# Patient Record
Sex: Female | Born: 1945 | Race: White | Hispanic: No | Marital: Married | Smoking: Former smoker
Health system: Southern US, Community
[De-identification: ages and names within clinical notes are randomized; demographics above are authoritative.]

## PROBLEM LIST (undated history)

## (undated) DIAGNOSIS — E079 Disorder of thyroid, unspecified: Secondary | ICD-10-CM

## (undated) DIAGNOSIS — N183 Chronic kidney disease, stage 3 unspecified: Secondary | ICD-10-CM

## (undated) DIAGNOSIS — C44621 Squamous cell carcinoma of skin of unspecified upper limb, including shoulder: Secondary | ICD-10-CM

## (undated) DIAGNOSIS — E039 Hypothyroidism, unspecified: Secondary | ICD-10-CM

## (undated) DIAGNOSIS — J449 Chronic obstructive pulmonary disease, unspecified: Secondary | ICD-10-CM

## (undated) DIAGNOSIS — R7303 Prediabetes: Secondary | ICD-10-CM

## (undated) HISTORY — DX: Chronic obstructive pulmonary disease, unspecified: J44.9

## (undated) HISTORY — DX: Squamous cell carcinoma of skin of unspecified upper limb, including shoulder: C44.621

## (undated) HISTORY — PX: TONSILLECTOMY: SUR1361

## (undated) HISTORY — PX: SQUAMOUS CELL CARCINOMA EXCISION: SHX2433

## (undated) HISTORY — PX: BREAST CYST EXCISION: SHX579

## (undated) HISTORY — PX: FOOT SURGERY: SHX648

## (undated) HISTORY — PX: KNEE SURGERY: SHX244

## (undated) HISTORY — DX: Disorder of thyroid, unspecified: E07.9

---

## 2004-05-25 ENCOUNTER — Ambulatory Visit: Payer: Self-pay | Admitting: Family Medicine

## 2004-06-06 ENCOUNTER — Ambulatory Visit: Payer: Self-pay | Admitting: Family Medicine

## 2004-11-21 ENCOUNTER — Ambulatory Visit: Payer: Self-pay | Admitting: Family Medicine

## 2005-09-04 ENCOUNTER — Ambulatory Visit: Payer: Self-pay | Admitting: Unknown Physician Specialty

## 2005-12-05 ENCOUNTER — Ambulatory Visit: Payer: Self-pay | Admitting: Family Medicine

## 2006-05-21 ENCOUNTER — Ambulatory Visit: Payer: Self-pay | Admitting: Family Medicine

## 2006-08-12 ENCOUNTER — Ambulatory Visit: Payer: Self-pay | Admitting: Family Medicine

## 2007-03-19 ENCOUNTER — Ambulatory Visit: Payer: Self-pay | Admitting: Family Medicine

## 2008-03-02 ENCOUNTER — Ambulatory Visit: Payer: Self-pay | Admitting: Family Medicine

## 2008-06-16 ENCOUNTER — Ambulatory Visit: Payer: Self-pay | Admitting: Family Medicine

## 2009-06-30 ENCOUNTER — Ambulatory Visit: Payer: Self-pay | Admitting: Family Medicine

## 2012-08-05 DIAGNOSIS — C44621 Squamous cell carcinoma of skin of unspecified upper limb, including shoulder: Secondary | ICD-10-CM

## 2012-08-05 HISTORY — DX: Squamous cell carcinoma of skin of unspecified upper limb, including shoulder: C44.621

## 2016-10-11 ENCOUNTER — Ambulatory Visit
Admission: EM | Admit: 2016-10-11 | Discharge: 2016-10-11 | Disposition: A | Discharge status: Home / Self Care | Payer: Medicare Other | Attending: Family Medicine | Admitting: Family Medicine

## 2016-10-11 DIAGNOSIS — J0110 Acute frontal sinusitis, unspecified: Secondary | ICD-10-CM | POA: Diagnosis not present

## 2016-10-11 DIAGNOSIS — R0981 Nasal congestion: Secondary | ICD-10-CM | POA: Diagnosis present

## 2016-10-11 DIAGNOSIS — Z87891 Personal history of nicotine dependence: Secondary | ICD-10-CM | POA: Insufficient documentation

## 2016-10-11 DIAGNOSIS — J029 Acute pharyngitis, unspecified: Secondary | ICD-10-CM

## 2016-10-11 DIAGNOSIS — E039 Hypothyroidism, unspecified: Secondary | ICD-10-CM | POA: Diagnosis not present

## 2016-10-11 LAB — RAPID STREP SCREEN (MED CTR MEBANE ONLY): STREPTOCOCCUS, GROUP A SCREEN (DIRECT): NEGATIVE

## 2016-10-11 MED ORDER — AMOXICILLIN-POT CLAVULANATE 875-125 MG PO TABS: 1.0000 | ORAL_TABLET | Freq: Two times a day (BID) | ORAL | 0 refills | Status: DC

## 2016-10-11 NOTE — ED Provider Notes (Signed)
[facility] ____________________________________________  Time seen: Approximately 9:29 AM  I have reviewed the triage vital signs and the nursing notes.   HISTORY  Chief Complaint Sore Throat   HPI Catherine Ray is a 71 y.o. female  Presenting for complaints of 7-8 days of runny nose, nasal congestion, sore throat, sinus pressure and occasional cough. Denies fevers. Reports symptoms unresolved with otc mucinex and allegra. Reports some seasonal allergy history. Reports no home sick contacts, around a recent mover that was sick. Reports continues to eat and drink overall well. States sore throat has been moderate and can feel postnasal drainage. Reports has continued to remain active. Denies other complaints.   Denies chest pain, shortness of breath, abdominal pain, dysuria, extremity pain, extremity swelling or rash. Denies recent sickness. Reports currently moving and getting established in this area.    PCP: [place]   past medical history Hypothyroidism Seasonal allergies  There are no active problems to display for this patient.   Past Surgical History:  Procedure Laterality Date  . FOOT SURGERY    . KNEE SURGERY    . SQUAMOUS CELL CARCINOMA EXCISION     Arm  . TONSILLECTOMY       No current facility-administered medications for this encounter.   Current Outpatient Prescriptions:  .  albuterol (PROVENTIL) (2.5 MG/3ML) 0.083% nebulizer solution, Take 2.5 mg by nebulization as needed for wheezing or shortness of breath., Disp: , Rfl:  .  cholecalciferol (VITAMIN D) 1000 units tablet, Take 2,000 Units by mouth daily., Disp: , Rfl:  .  latanoprost (XALATAN) 0.005 % ophthalmic solution, 1 drop at bedtime., Disp: , Rfl:  .  levothyroxine (SYNTHROID, LEVOTHROID) 88 MCG tablet, Take 88 mcg by mouth daily before breakfast., Disp: , Rfl:  .  tiotropium (SPIRIVA) 18 MCG inhalation capsule, Place 18 mcg into inhaler and inhale daily., Disp: , Rfl:  .   amoxicillin-clavulanate (AUGMENTIN) 875-125 MG tablet, Take 1 tablet by mouth every 12 (twelve) hours., Disp: 20 tablet, Rfl: 0  Allergies Codeine  Family History  Problem Relation Age of Onset  . Cancer Mother   . Heart failure Father     Social History Social History  Substance Use Topics  . Smoking status: Former Smoker    Types: Cigarettes  . Smokeless tobacco: Never Used  . Alcohol use No    Review of Systems Constitutional: No fever/chills Eyes: No visual changes. ENT: As above. Cardiovascular: Denies chest pain. Respiratory: Denies shortness of breath. Gastrointestinal: No abdominal pain.   Genitourinary: Negative for dysuria. Musculoskeletal: Negative for back pain. Skin: Negative for rash. Neurological: Negative for focal weakness or numbness.  10-point ROS otherwise negative.  ____________________________________________   PHYSICAL EXAM:  VITAL SIGNS: ED Triage Vitals  Enc Vitals Group     BP 10/11/16 0913 140/67     Pulse Rate 10/11/16 0913 88     Resp 10/11/16 0913 20     Temp 10/11/16 0913 99 F (37.2 C)     Temp Source 10/11/16 0913 Oral     SpO2 10/11/16 0913 99 %     Weight 10/11/16 0912 185 lb (83.9 kg)     Height 10/11/16 0912 5\' 3"  (1.6 m)     Head Circumference --      Peak Flow --      Pain Score 10/11/16 0913 9     Pain Loc --      Pain Edu? --      Excl. in [place]? --  Constitutional: Alert and oriented. Well appearing and in no acute distress. Eyes: Conjunctivae are normal. PERRL. EOMI. Head: Atraumatic.Mild tenderness to palpation bilateral frontal and nontender maxillary sinuses. No swelling. No erythema.   Ears: no erythema, normal TMs bilaterally.   Nose: nasal congestion with bilateral nasal turbinate erythema and edema.   Mouth/Throat: Mucous membranes are moist.  Mild pharyngeal erythema.No tonsillar swelling or exudate.  Neck: No stridor.  No cervical spine tenderness to palpation. Hematological/Lymphatic/Immunilogical:  No cervical lymphadenopathy. Cardiovascular: Normal rate, regular rhythm. Grossly normal heart sounds.  Good peripheral circulation. Respiratory: Normal respiratory effort.  No retractions.  No wheezes, rales or rhonchi. Good air movement.  Gastrointestinal: Soft and nontender.  Musculoskeletal: No lower extremity edema noted. No cervical, thoracic or lumbar tenderness to palpation.  Neurologic:  Normal speech and language.No gait instability. Skin:  Skin is warm, dry and intact. No rash noted. Psychiatric: Mood and affect are normal. Speech and behavior are normal.  ___________________________________________   LABS (all labs ordered are listed, but only abnormal results are displayed)  Labs Reviewed  RAPID STREP SCREEN (NOT AT [facility])  CULTURE, GROUP A STREP [facility])     PROCEDURES Procedures   INITIAL IMPRESSION / ASSESSMENT AND PLAN / ED COURSE  Pertinent labs & imaging results that were available during my care of the patient were reviewed by me and considered in my medical decision making (see chart for details).  Well appearing, no acute distress. Quick strep negative, will culture. Suspect sinusitis and postnasal drainage. Discussed with patient viral and allergic factors. Encouraged supportive care, antihistamine, gargles, and will start patient on oral Augmentin. Discussed indication, risks and benefits of medications with patient.Information given for local ENT and PCP.   Discussed follow up with Primary care physician this week as needed. Discussed follow up and return parameters including no resolution or any worsening concerns. Patient verbalized understanding and agreed to plan.   ____________________________________________   FINAL CLINICAL IMPRESSION(S) / ED DIAGNOSES  Final diagnoses:  Acute frontal sinusitis, recurrence not specified  Pharyngitis, unspecified etiology     Discharge Medication List as of 10/11/2016  9:42 AM    START taking these medications    Details  amoxicillin-clavulanate (AUGMENTIN) 875-125 MG tablet Take 1 tablet by mouth every 12 (twelve) hours., Starting Fri 10/11/2016, Normal        Note: This dictation was prepared with Dragon dictation along with smaller phrase technology. Any transcriptional errors that result from this process are unintentional.         Marylene Land, NP 10/11/16 1130

## 2016-10-11 NOTE — Discharge Instructions (Signed)
Take medication as prescribed. Rest. Drink plenty of fluids.  ° °Follow up with your primary care physician this week as needed. Return to Urgent care for new or worsening concerns.  ° °

## 2016-10-11 NOTE — ED Triage Notes (Signed)
Pt c/o sore throat, congestion, ear ache and cough for a few days.

## 2016-10-13 LAB — CULTURE, GROUP A STREP (THRC)

## 2017-04-28 ENCOUNTER — Other Ambulatory Visit
Admission: RE | Admit: 2017-04-28 | Discharge: 2017-04-28 | Disposition: A | Discharge status: Home / Self Care | Payer: Medicare Other | Source: Ambulatory Visit | Attending: Family Medicine | Admitting: Family Medicine

## 2017-04-28 ENCOUNTER — Encounter: Payer: Self-pay | Admitting: Family Medicine

## 2017-04-28 ENCOUNTER — Ambulatory Visit (INDEPENDENT_AMBULATORY_CARE_PROVIDER_SITE_OTHER): Payer: Medicare Other | Admitting: Family Medicine

## 2017-04-28 VITALS — BP 128/82 | HR 66 | Resp 16 | Ht 63.0 in | Wt 182.6 lb

## 2017-04-28 DIAGNOSIS — E559 Vitamin D deficiency, unspecified: Secondary | ICD-10-CM | POA: Insufficient documentation

## 2017-04-28 DIAGNOSIS — E039 Hypothyroidism, unspecified: Secondary | ICD-10-CM

## 2017-04-28 DIAGNOSIS — M150 Primary generalized (osteo)arthritis: Secondary | ICD-10-CM | POA: Diagnosis not present

## 2017-04-28 DIAGNOSIS — M79672 Pain in left foot: Secondary | ICD-10-CM

## 2017-04-28 DIAGNOSIS — G8929 Other chronic pain: Secondary | ICD-10-CM | POA: Diagnosis not present

## 2017-04-28 DIAGNOSIS — H409 Unspecified glaucoma: Secondary | ICD-10-CM

## 2017-04-28 DIAGNOSIS — R7303 Prediabetes: Secondary | ICD-10-CM

## 2017-04-28 DIAGNOSIS — Z23 Encounter for immunization: Secondary | ICD-10-CM

## 2017-04-28 DIAGNOSIS — E669 Obesity, unspecified: Secondary | ICD-10-CM | POA: Diagnosis not present

## 2017-04-28 DIAGNOSIS — J449 Chronic obstructive pulmonary disease, unspecified: Secondary | ICD-10-CM | POA: Insufficient documentation

## 2017-04-28 DIAGNOSIS — M1990 Unspecified osteoarthritis, unspecified site: Secondary | ICD-10-CM | POA: Insufficient documentation

## 2017-04-28 DIAGNOSIS — E66811 Obesity, class 1: Secondary | ICD-10-CM

## 2017-04-28 DIAGNOSIS — M159 Polyosteoarthritis, unspecified: Secondary | ICD-10-CM

## 2017-04-28 LAB — COMPREHENSIVE METABOLIC PANEL
ALBUMIN: 4.1 g/dL (ref 3.5–5.0)
ALT: 13 U/L — ABNORMAL LOW (ref 14–54)
ANION GAP: 7 (ref 5–15)
AST: 16 U/L (ref 15–41)
Alkaline Phosphatase: 79 U/L (ref 38–126)
BILIRUBIN TOTAL: 0.4 mg/dL (ref 0.3–1.2)
BUN: 18 mg/dL (ref 6–20)
CALCIUM: 9.2 mg/dL (ref 8.9–10.3)
CO2: 29 mmol/L (ref 22–32)
Chloride: 103 mmol/L (ref 101–111)
Creatinine, Ser: 0.93 mg/dL (ref 0.44–1.00)
Glucose, Bld: 108 mg/dL — ABNORMAL HIGH (ref 65–99)
Potassium: 4.8 mmol/L (ref 3.5–5.1)
Sodium: 139 mmol/L (ref 135–145)
TOTAL PROTEIN: 7.6 g/dL (ref 6.5–8.1)

## 2017-04-28 LAB — CBC
HEMATOCRIT: 38.8 % (ref 35.0–47.0)
HEMOGLOBIN: 13.1 g/dL (ref 12.0–16.0)
MCH: 29.8 pg (ref 26.0–34.0)
MCHC: 33.7 g/dL (ref 32.0–36.0)
MCV: 88.4 fL (ref 80.0–100.0)
Platelets: 289 10*3/uL (ref 150–440)
RBC: 4.39 MIL/uL (ref 3.80–5.20)
RDW: 13.3 % (ref 11.5–14.5)
WBC: 6.3 10*3/uL (ref 3.6–11.0)

## 2017-04-28 LAB — HEMOGLOBIN A1C
Hgb A1c MFr Bld: 6 % — ABNORMAL HIGH (ref 4.8–5.6)
MEAN PLASMA GLUCOSE: 125.5 mg/dL

## 2017-04-28 LAB — LIPID PANEL
CHOL/HDL RATIO: 3 ratio
CHOLESTEROL: 186 mg/dL (ref 0–200)
HDL: 61 mg/dL (ref >40)
LDL Cholesterol: 104 mg/dL — ABNORMAL HIGH (ref 0–99)
TRIGLYCERIDES: 106 mg/dL (ref <150)
VLDL: 21 mg/dL (ref 0–40)

## 2017-04-28 LAB — TSH: TSH: 0.414 u[IU]/mL (ref 0.350–4.500)

## 2017-04-28 MED ORDER — ACETAMINOPHEN 500 MG PO TABS: 1000.0000 mg | ORAL_TABLET | Freq: Three times a day (TID) | ORAL | 0 refills | Status: DC | PRN

## 2017-04-28 NOTE — Progress Notes (Signed)
Date:  04/28/2017   Name:  Catherine Ray   DOB:  [birth year]   MRN:  [id number]  PCP:  Adline Potter, MD    Chief Complaint: Establish Care (New to area. ); Foot Problem (Left foot pain needs referral to foot doctor. ); COPD (needs Pulm ref); and Thyroid Problem (will need refill)   History of Present Illness:  This is a 71 y.o. female seen for initial visit. Just moved back to area from [place] chronic L foot pain, has seen podiatry and ortho, requests podiatry referral. COPD on Spiriva and prn albuterol MDI, has seen Dr. Raul Del in past, requests referral. OA hands, takes Aleve prn. Hypothyroid on Synthroid same dose x yrs, last TSH last year ok. A1c 6.2% last visit, on NAS diet, unable to exercise due to foot pain. Glaucoma on eye gtts, saw optho recently. Takes vit D supp for deficiency, last DEXA 2013 normal. Occ BLE edema which improves overnight. Father died unknown cause, mother died 76 heart dz had thyroid dz/DM, sister with breast ca, sister with MI/DM. Last colonoscopy 2007 showed polyp, wants to defer until more settled. Mammo normal 08/2016. Tdap 2014, Prevnar 07/2015, zoster 2012, unsure about Pneumovax, will check with [facility].  Review of Systems:  Review of Systems  Constitutional: Negative for fever.  HENT: Negative for ear pain, sinus pain and trouble swallowing.   Eyes: Negative for pain.  Respiratory: Negative for cough and shortness of breath.   Cardiovascular: Negative for chest pain.  Gastrointestinal: Negative for abdominal pain, constipation and diarrhea.  Endocrine: Negative for polydipsia and polyuria.  Genitourinary: Negative for difficulty urinating.  Musculoskeletal: Negative for joint swelling.  Neurological: Negative for syncope and light-headedness.    Patient Active Problem List   Diagnosis Date Noted  . Hypothyroidism 04/28/2017  . Glaucoma 04/28/2017  . Vitamin D deficiency 04/28/2017  . COPD (chronic obstructive pulmonary disease) ([place])  04/28/2017  . Chronic pain in left foot 04/28/2017  . Prediabetes 04/28/2017    Prior to Admission medications   Medication Sig Start Date End Date Taking? Authorizing Provider  albuterol (PROVENTIL HFA;VENTOLIN HFA) 108 (90 Base) MCG/ACT inhaler Inhale 2 puffs into the lungs every 6 (six) hours as needed.   Yes [provider]  cholecalciferol (VITAMIN D) 1000 units tablet Take 1,000 Units by mouth daily.   Yes [provider]  latanoprost (XALATAN) 0.005 % ophthalmic solution 1 drop at bedtime.   Yes [provider]  levothyroxine (SYNTHROID, LEVOTHROID) 88 MCG tablet Take 88 mcg by mouth daily before breakfast.   Yes [provider]  tiotropium (SPIRIVA) 18 MCG inhalation capsule Place 18 mcg into inhaler and inhale daily.   Yes [provider]    Allergies  Allergen Reactions  . Codeine Itching and Nausea Only    Past Surgical History:  Procedure Laterality Date  . FOOT SURGERY    . KNEE SURGERY    . SQUAMOUS CELL CARCINOMA EXCISION     Arm  . TONSILLECTOMY      Social History  Substance Use Topics  . Smoking status: Former Smoker    Types: Cigarettes  . Smokeless tobacco: Never Used  . Alcohol use No    Family History  Problem Relation Age of Onset  . Cancer Mother   . Hypertension Mother   . Diabetes Mother   . Heart failure Father   . Hypertension Sister   . Diabetes Sister   . Cancer Sister   . Stroke Sister  Medication list has been reviewed and updated.  Physical Examination: BP 128/82   Pulse 66   Resp 16   Ht 5\' 3"  (1.6 m)   Wt 182 lb 9.6 oz (82.8 kg)   SpO2 98%   BMI 32.35 kg/m   Physical Exam  Constitutional: She is oriented to person, place, and time. She appears well-developed and well-nourished.  HENT:  Right Ear: External ear normal.  Left Ear: External ear normal.  Nose: Nose normal.  Mouth/Throat: Oropharynx is clear and moist.  TMs clear  Eyes: Pupils are equal, round, and reactive  to light. Conjunctivae and EOM are normal.  Neck: Neck supple. No thyromegaly present.  Cardiovascular: Normal rate, regular rhythm and normal heart sounds.   Pulmonary/Chest: Effort normal and breath sounds normal.  Abdominal: Soft. She exhibits no distension and no mass. There is no tenderness.  Musculoskeletal:  Trace BLE edema  Lymphadenopathy:    She has no cervical adenopathy.  Neurological: She is alert and oriented to person, place, and time. Coordination normal.  Romberg neg, gait normal  Skin: Skin is warm and dry.  Psychiatric: She has a normal mood and affect. Her behavior is normal.  Nursing note and vitals reviewed.   Assessment and Plan:  1. Chronic obstructive pulmonary disease, unspecified COPD type ([place]) Stable on Spiriva. prn albuterol - Ambulatory referral to Pulmonology - Comprehensive Metabolic Panel (CMET) - CBC  2. Chronic pain in left foot Has seen podiatry/ortho in past - Ambulatory referral to Podiatry  3. Hypothyroidism, unspecified type On Synthroid - TSH - Lipid Profile  4. Glaucoma, unspecified glaucoma type, unspecified laterality On eye gtts per optho  5. Vitamin D deficiency On supplement - Vitamin D (25 hydroxy)  6. Primary osteoarthritis involving multiple joints Advised change prn Aleve to prn Tylenol  7. Prediabetes On NAS diet - HgB A1c  8. Obesity (BMI 30.0-34.9) Exercise/ weight loss discussed  9. Need for influenza vaccination - Flu vaccine HIGH DOSE PF (Fluzone High dose)  Return in about 4 weeks (around 05/26/2017).   45 mins spent with pt over half in counseling  Satira Anis. [facility]  04/28/2017

## 2017-04-29 LAB — VITAMIN D 25 HYDROXY (VIT D DEFICIENCY, FRACTURES): Vit D, 25-Hydroxy: 47.4 ng/mL (ref 30.0–100.0)

## 2017-05-27 ENCOUNTER — Encounter: Payer: Self-pay | Admitting: Family Medicine

## 2017-05-27 ENCOUNTER — Ambulatory Visit (INDEPENDENT_AMBULATORY_CARE_PROVIDER_SITE_OTHER): Payer: Medicare Other | Admitting: Family Medicine

## 2017-05-27 VITALS — BP 122/82 | HR 71 | Resp 16 | Ht 63.0 in | Wt 184.0 lb

## 2017-05-27 DIAGNOSIS — G8929 Other chronic pain: Secondary | ICD-10-CM

## 2017-05-27 DIAGNOSIS — M159 Polyosteoarthritis, unspecified: Secondary | ICD-10-CM

## 2017-05-27 DIAGNOSIS — Z23 Encounter for immunization: Secondary | ICD-10-CM

## 2017-05-27 DIAGNOSIS — R7303 Prediabetes: Secondary | ICD-10-CM

## 2017-05-27 DIAGNOSIS — E039 Hypothyroidism, unspecified: Secondary | ICD-10-CM

## 2017-05-27 DIAGNOSIS — J449 Chronic obstructive pulmonary disease, unspecified: Secondary | ICD-10-CM

## 2017-05-27 DIAGNOSIS — E559 Vitamin D deficiency, unspecified: Secondary | ICD-10-CM | POA: Diagnosis not present

## 2017-05-27 DIAGNOSIS — M79672 Pain in left foot: Secondary | ICD-10-CM

## 2017-05-27 DIAGNOSIS — E669 Obesity, unspecified: Secondary | ICD-10-CM

## 2017-05-27 DIAGNOSIS — Z1231 Encounter for screening mammogram for malignant neoplasm of breast: Secondary | ICD-10-CM | POA: Diagnosis not present

## 2017-05-27 DIAGNOSIS — M150 Primary generalized (osteo)arthritis: Secondary | ICD-10-CM | POA: Diagnosis not present

## 2017-05-27 DIAGNOSIS — Z1239 Encounter for other screening for malignant neoplasm of breast: Secondary | ICD-10-CM

## 2017-05-27 NOTE — Progress Notes (Signed)
Date:  05/27/2017   Name:  Catherine Ray   DOB:  [birth year]   MRN:  [id number]  PCP:  Adline Potter, MD    Chief Complaint: COPD (f/u needs Mammogram and Dexa ordered and she can go down and have them request last mammo and Dexa from last doctor out of state. )   History of Present Illness:  This is a 71 y.o. female seen for one month f/u from initial visit. COPD stable, to see pulm next week. Saw podiatry for L foot pain, unable to help. TSH ok on Synthroid. Taking Tylenol prn OA pain. Prediabetes stable. Had Prevnar, Pneumovax due. Had Tdap 2015. Colonoscopy 2013 with polyp, told repeat in 5 yrs, wants to wait until things settle. FH breast ca, last mammo 08/2016, DEXA normal 2013, vit D level normal on supplement.  Review of Systems:  Review of Systems  Constitutional: Negative for chills and fever.  Respiratory: Negative for cough and shortness of breath.   Cardiovascular: Negative for chest pain and leg swelling.  Genitourinary: Negative for difficulty urinating.  Neurological: Negative for syncope and light-headedness.    Patient Active Problem List   Diagnosis Date Noted  . Hypothyroidism 04/28/2017  . Glaucoma 04/28/2017  . Vitamin D deficiency 04/28/2017  . COPD (chronic obstructive pulmonary disease) ([place]) 04/28/2017  . Chronic pain in left foot 04/28/2017  . Prediabetes 04/28/2017  . Osteoarthritis 04/28/2017  . Obesity (BMI 30.0-34.9) 04/28/2017    Prior to Admission medications   Medication Sig Start Date End Date Taking? Authorizing Provider  acetaminophen (TYLENOL) 500 MG tablet Take 2 tablets by mouth 2 (two) times daily as needed. 04/28/17  Yes [provider]  albuterol (PROVENTIL HFA;VENTOLIN HFA) 108 (90 Base) MCG/ACT inhaler Inhale 2 puffs into the lungs every 6 (six) hours as needed.   Yes [provider]  cholecalciferol (VITAMIN D) 1000 units tablet Take 1,000 Units by mouth daily.   Yes [provider]  latanoprost (XALATAN)  0.005 % ophthalmic solution 1 drop at bedtime.   Yes [provider]  levothyroxine (SYNTHROID, LEVOTHROID) 88 MCG tablet Take 88 mcg by mouth daily before breakfast.   Yes [provider]  tiotropium (SPIRIVA) 18 MCG inhalation capsule Place 18 mcg into inhaler and inhale daily.   Yes [provider]    Allergies  Allergen Reactions  . Codeine Itching and Nausea Only    Past Surgical History:  Procedure Laterality Date  . FOOT SURGERY    . KNEE SURGERY    . SQUAMOUS CELL CARCINOMA EXCISION     Arm  . TONSILLECTOMY      Social History  Substance Use Topics  . Smoking status: Former Smoker    Types: Cigarettes  . Smokeless tobacco: Never Used  . Alcohol use No    Family History  Problem Relation Age of Onset  . Cancer Mother   . Hypertension Mother   . Diabetes Mother   . Heart failure Father   . Hypertension Sister   . Diabetes Sister   . Cancer Sister   . Stroke Sister     Medication list has been reviewed and updated.  Physical Examination: BP 122/82   Pulse 71   Resp 16   Ht 5\' 3"  (1.6 m)   Wt 184 lb (83.5 kg)   SpO2 97%   BMI 32.59 kg/m   Physical Exam  Constitutional: She appears well-developed and well-nourished.  Cardiovascular: Normal rate, regular rhythm and normal heart sounds.   Pulmonary/Chest:  Effort normal and breath sounds normal.  Musculoskeletal: She exhibits no edema.  Neurological: She is alert.  Skin: Skin is warm and dry.  Psychiatric: She has a normal mood and affect. Her behavior is normal.  Nursing note and vitals reviewed.   Assessment and Plan:  1. Chronic obstructive pulmonary disease, unspecified COPD type (HCC) Stable on Spiriva/albuterol prn, to see Dr. Raul Del next week, saw in past  2. Hypothyroidism, unspecified type Well controlled on Synthroid  3. Primary osteoarthritis involving multiple joints Adequate control on prn Tylenol  4. Chronic pain in left foot Saw podiatry, no further  rx recs  5. Prediabetes Stable, rx/px discussed, monitor  6. Vitamin D deficiency Well controlled on supplement  7. Obesity (BMI 30.0-34.9) Weight up 2#, exercise/weight loss discussed  8. Need for pneumococcal vaccination - Pneumococcal polysaccharide vaccine 23-valent greater than or equal to 2yo subcutaneous/IM  9. Breast cancer screening - MM Digital Screening; Future  Return in about 6 months (around 11/25/2017).  Satira Anis. [facility]  05/27/2017

## 2017-06-13 ENCOUNTER — Inpatient Hospital Stay
Admission: RE | Admit: 2017-06-13 | Discharge: 2017-06-13 | Disposition: A | Discharge status: Home / Self Care | Payer: Self-pay | Source: Ambulatory Visit | Attending: *Deleted | Admitting: *Deleted

## 2017-06-13 ENCOUNTER — Other Ambulatory Visit: Payer: Self-pay | Admitting: *Deleted

## 2017-06-13 DIAGNOSIS — Z9289 Personal history of other medical treatment: Secondary | ICD-10-CM

## 2017-07-01 ENCOUNTER — Telehealth: Payer: Self-pay

## 2017-07-01 NOTE — Telephone Encounter (Signed)
Needs refill on Levothyroxine.Please send this to [facility].

## 2017-07-02 ENCOUNTER — Other Ambulatory Visit: Payer: Self-pay | Admitting: Family Medicine

## 2017-07-02 MED ORDER — LEVOTHYROXINE SODIUM 88 MCG PO TABS: 88.0000 ug | ORAL_TABLET | Freq: Every day | ORAL | 3 refills | Status: DC

## 2017-07-02 NOTE — Telephone Encounter (Signed)
Done

## 2017-09-10 ENCOUNTER — Ambulatory Visit (INDEPENDENT_AMBULATORY_CARE_PROVIDER_SITE_OTHER): Payer: Medicare Other | Admitting: Family Medicine

## 2017-09-10 ENCOUNTER — Encounter: Payer: Self-pay | Admitting: Family Medicine

## 2017-09-10 VITALS — BP 128/82 | HR 98 | Temp 98.3°F | Resp 16 | Ht 63.0 in | Wt 190.0 lb

## 2017-09-10 DIAGNOSIS — F4321 Adjustment disorder with depressed mood: Secondary | ICD-10-CM

## 2017-09-10 DIAGNOSIS — R7303 Prediabetes: Secondary | ICD-10-CM | POA: Diagnosis not present

## 2017-09-10 DIAGNOSIS — E039 Hypothyroidism, unspecified: Secondary | ICD-10-CM | POA: Diagnosis not present

## 2017-09-10 DIAGNOSIS — G5622 Lesion of ulnar nerve, left upper limb: Secondary | ICD-10-CM | POA: Diagnosis not present

## 2017-09-10 DIAGNOSIS — J449 Chronic obstructive pulmonary disease, unspecified: Secondary | ICD-10-CM | POA: Diagnosis not present

## 2017-09-10 DIAGNOSIS — Z634 Disappearance and death of family member: Secondary | ICD-10-CM

## 2017-09-10 DIAGNOSIS — F4329 Adjustment disorder with other symptoms: Secondary | ICD-10-CM | POA: Diagnosis not present

## 2017-09-10 DIAGNOSIS — E559 Vitamin D deficiency, unspecified: Secondary | ICD-10-CM

## 2017-09-10 DIAGNOSIS — J0110 Acute frontal sinusitis, unspecified: Secondary | ICD-10-CM

## 2017-09-10 MED ORDER — AZITHROMYCIN 250 MG PO TABS: ORAL_TABLET | ORAL | 0 refills | Status: DC

## 2017-09-10 NOTE — Progress Notes (Signed)
Date:  09/10/2017   Name:  Catherine Ray   DOB:  [birth year]   MRN:  [id number]  PCP:  Adline Potter, MD    Chief Complaint: Sore Throat (Had cold last week and now throat hurts.) and Numbness (L hand and arm tingle and numbness hx of  pinched C7 nerve, has MRI on hand from 2015.)   History of Present Illness:  This is a 72 y.o. female seen same day for 2d hx sinus congestion, mild NP cough, sore throat, L anterior cervical adenopathy. Had rhinorrhea and sneezing last week but seemed to be getting better. Also c/o LUE numbness below elbow, hx C7 radiculopathy 2015 resolved with injections/PT, current sxs started about 3 weeks ago. COPD followed by Dr. Raul Del. Hypothyroidism on Synthroid, vit D def on supp. Also tearful with CMA about husband's death six months ago.  Review of Systems:  Review of Systems  Constitutional: Negative for chills and fever.  HENT: Negative for ear pain.   Respiratory: Negative for shortness of breath.   Cardiovascular: Negative for chest pain and leg swelling.  Gastrointestinal: Negative for abdominal pain.  Genitourinary: Negative for difficulty urinating.  Neurological: Negative for syncope and light-headedness.    Patient Active Problem List   Diagnosis Date Noted  . Hypothyroidism 04/28/2017  . Glaucoma 04/28/2017  . Vitamin D deficiency 04/28/2017  . COPD (chronic obstructive pulmonary disease) ([place]) 04/28/2017  . Chronic pain in left foot 04/28/2017  . Prediabetes 04/28/2017  . Osteoarthritis 04/28/2017  . Obesity (BMI 30.0-34.9) 04/28/2017    Prior to Admission medications   Medication Sig Start Date End Date Taking? Authorizing Provider  acetaminophen (TYLENOL) 500 MG tablet Take 2 tablets by mouth 2 (two) times daily as needed. 04/28/17  Yes [provider]  albuterol (PROVENTIL HFA;VENTOLIN HFA) 108 (90 Base) MCG/ACT inhaler Inhale 2 puffs into the lungs every 6 (six) hours as needed.   Yes [provider]  cholecalciferol  (VITAMIN D) 1000 units tablet Take 1,000 Units by mouth daily.   Yes [provider]  latanoprost (XALATAN) 0.005 % ophthalmic solution 1 drop at bedtime.   Yes [provider]  levothyroxine (SYNTHROID, LEVOTHROID) 88 MCG tablet Take 1 tablet (88 mcg total) by mouth daily before breakfast. 07/02/17  Yes Donterius Filley, Gwyndolyn Saxon, MD  Phenylephrine-DM-GG (TUSSIN CF PO) Take by mouth.   Yes [provider]  pseudoephedrine (SUDAFED) 30 MG tablet Take 30 mg by mouth every 4 (four) hours as needed for congestion.   Yes [provider]  tiotropium (SPIRIVA) 18 MCG inhalation capsule Place 18 mcg into inhaler and inhale daily.   Yes [provider]  azithromycin (ZITHROMAX) 250 MG tablet Take two tablets today then one tablet daily for four days 09/10/17   Adline Potter, MD    Allergies  Allergen Reactions  . Codeine Itching and Nausea Only    Past Surgical History:  Procedure Laterality Date  . FOOT SURGERY    . KNEE SURGERY    . SQUAMOUS CELL CARCINOMA EXCISION     Arm  . TONSILLECTOMY      Social History   Tobacco Use  . Smoking status: Former Smoker    Types: Cigarettes  . Smokeless tobacco: Never Used  Substance Use Topics  . Alcohol use: No  . Drug use: No    Family History  Problem Relation Age of Onset  . Cancer Mother   . Hypertension Mother   . Diabetes Mother   . Heart failure Father   .  Hypertension Sister   . Diabetes Sister   . Cancer Sister   . Stroke Sister     Medication list has been reviewed and updated.  Physical Examination: BP 128/82   Pulse 98   Temp 98.3 F (36.8 C)   Resp 16   Ht 5\' 3"  (1.6 m)   Wt 190 lb (86.2 kg)   SpO2 98%   BMI 33.66 kg/m   Physical Exam  Constitutional: She appears well-developed and well-nourished.  HENT:  Right Ear: External ear normal.  Left Ear: External ear normal.  Mouth/Throat: Oropharynx is clear and moist.  TMs clear Moderate max/frontal sinus tenderness  Neck: Neck  supple.  Mild L anterior cervical adenopathy  Cardiovascular: Normal rate, regular rhythm and normal heart sounds.  Pulmonary/Chest: Effort normal and breath sounds normal.  Musculoskeletal: She exhibits no edema.  Neurological: She is alert.  Skin: Skin is warm and dry.  Psychiatric: She has a normal mood and affect. Her behavior is normal.  Nursing note and vitals reviewed.   Assessment and Plan:  1. Acute non-recurrent frontal sinusitis Zpak as directed, Sudafed prn, call if sxs worsen/persist  2. Chronic obstructive pulmonary disease, unspecified COPD type (HCC) Stable, Dr. 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 Hypothyroidism, unspecified type Well controlled on Synthroid  5. Prediabetes Well controlled in Sept  6. Complicated grief Refer [place] for bereavement counseling  7. Vitamin D deficiency Well controlled on supplement  Return in about 2 months (around 11/08/2017).  Satira Anis. [facility]  09/10/2017

## 2017-09-10 NOTE — Patient Instructions (Signed)
Take Aleve two tablets twice daily for one week only.

## 2017-09-15 ENCOUNTER — Ambulatory Visit
Admission: RE | Admit: 2017-09-15 | Discharge: 2017-09-15 | Disposition: A | Discharge status: Home / Self Care | Payer: Medicare Other | Source: Ambulatory Visit | Attending: Family Medicine | Admitting: Family Medicine

## 2017-09-15 DIAGNOSIS — Z1231 Encounter for screening mammogram for malignant neoplasm of breast: Secondary | ICD-10-CM | POA: Insufficient documentation

## 2017-09-15 DIAGNOSIS — Z1239 Encounter for other screening for malignant neoplasm of breast: Secondary | ICD-10-CM

## 2017-09-26 ENCOUNTER — Other Ambulatory Visit: Payer: Self-pay | Admitting: Specialist

## 2017-09-26 DIAGNOSIS — R1312 Dysphagia, oropharyngeal phase: Secondary | ICD-10-CM

## 2017-10-09 ENCOUNTER — Ambulatory Visit
Admission: RE | Admit: 2017-10-09 | Discharge: 2017-10-09 | Disposition: A | Discharge status: Home / Self Care | Payer: Medicare Other | Source: Ambulatory Visit | Attending: Specialist | Admitting: Specialist

## 2017-10-09 DIAGNOSIS — R1310 Dysphagia, unspecified: Secondary | ICD-10-CM | POA: Diagnosis present

## 2017-10-09 DIAGNOSIS — R1314 Dysphagia, pharyngoesophageal phase: Secondary | ICD-10-CM

## 2017-10-09 DIAGNOSIS — R1312 Dysphagia, oropharyngeal phase: Secondary | ICD-10-CM

## 2017-10-09 NOTE — Therapy (Addendum)
[place], [place], [address] Phone: [phone]   Fax:     Modified Barium Swallow  Patient Details  Name: Catherine Ray MRN: [id number] Date of Birth: 13-Sep-1945 No Data Recorded  Encounter Date: 10/09/2017  End of Session - 10/09/17 1816    Visit Number  1    Number of Visits  1    Date for SLP Re-Evaluation  10/09/17    SLP Start Time  26    SLP Stop Time   1400    SLP Time Calculation (min)  60 min    Activity Tolerance  Patient tolerated treatment well       Past Medical History:  Diagnosis Date  . COPD (chronic obstructive pulmonary disease) ([place])   . Squamous cell cancer of skin of forearm 2014  . Thyroid disease     Past Surgical History:  Procedure Laterality Date  . BREAST CYST EXCISION Bilateral    neg  . FOOT SURGERY    . KNEE SURGERY    . SQUAMOUS CELL CARCINOMA EXCISION     Arm  . TONSILLECTOMY      There were no vitals filed for this visit.        Subjective: Patient behavior: (alertness, ability to follow instructions, etc.): Pt A/O x4. She eats a regular diet; native dentition. She indicated she may have had REFLUX-like symptoms in the past but has not been on a PPI. She stated she does use Rolaids/TUMS "more now" and that she does have increased stress in her life w/ her husband's illness. Chief complaint: dysphagia. Reportedly feels like she has to "swallow hard to push food down". Denies any overt s/s of aspiration.    Objective:  Radiological Procedure: A videoflouroscopic evaluation of oral-preparatory, reflex initiation, and pharyngeal phases of the swallow was performed; as well as a screening of the upper esophageal phase.  I. POSTURE: upright II. VIEW: lateral III. COMPENSATORY STRATEGIES: None indicated - although bolus consistencies were alternated IV. BOLUSES ADMINISTERED:  Thin Liquid: 6 trials (multiple)  Nectar-thick Liquid: 2  trials  Honey-thick Liquid: NT  Puree: 3 trials  Mechanical Soft: 2 trials V. RESULTS OF EVALUATION: A. ORAL PREPARATORY PHASE: (The lips, tongue, and velum are observed for strength and coordination)       **Overall Severity Rating: WFL. Oral phase c/b timely bolus management and A-P transfer for swallow; adequate bolus mastication w/ increased texture; oral clearing noted w/ all trials.   B. SWALLOW INITIATION/REFLEX: (The reflex is normal if "triggered" by the time the bolus reached the base of the tongue)  **Overall Severity Rating: [facility]. Timely pharyngeal swallow initiation w/ all trials; no laryngeal penetration or aspiration noted w/ trials.   C. PHARYNGEAL PHASE: (Pharyngeal function is normal if the bolus shows rapid, smooth, and continuous transit through the pharynx and there is no pharyngeal residue after the swallow)  **Overall Severity Rating: [facility]. Pharyngeal clearing noted w/ all trial consistencies indicating adequate laryngeal excursion and pharyngeal pressure during the swallow.   D. LARYNGEAL PENETRATION: (Material entering into the laryngeal inlet/vestibule but not aspirated): NONE E. ASPIRATION: NONE F. ESOPHAGEAL PHASE: (Screening of the upper esophagus): bony protrusions noted in the lower Cervical Esophagus, and slower clearing of the mid-Esophagus(below the shoulders) w/ puree trials  ASSESSMENT: Pt appeared to present w/ adequate oropharyngeal phase swallow function during this study; she is at reduced risk for aspiration from an oropharyngeal phase standpoint. During the Oral phase, timely bolus  management and A-P transfer for swallowing noted; adequate bolus mastication w/ increased texture, and oral clearing noted w/ all trials. During the Pharyngeal phase, timely pharyngeal swallow initiation w/ all trials(BOT-Valleculae); no laryngeal penetration or aspiration noted w/ trials. Appropriate pharyngeal clearing noted w/ all trial consistencies indicating adequate  laryngeal excursion and pharyngeal pressure during the swallow. During the Esophageal phase, noted bony prominences along the Cervical spine in the lower Cervical Esophageal space, and min slower clearing of the mid-Esophagus(below the shoulders) w/ puree trials - this presentation could be the impact on the pharyngeal phase of swallowing and related to her c/o of having to "swallow hard to push food down". Recommended f/u w/ GI for further assessment and education on Esophageal motility. Recommended pt keep a food diary including times of day and/or circumstances surrounding the times she experiences the discomfort for better information for GI/MD.   PLAN/RECOMMENDATIONS:  A. Diet: Regular diet w/ Meats cut Small, Moistened; Thin liquids. Monitor breads, meats for any discomfort during eating(Food Diary). Recommend Pills in Puree for easier swallowing if needed  B. Swallowing Precautions: general aspiration and Reflux precautions; alternate foods and liquids; take rest breaks during meals to allow for Esophageal clearing  C. Recommended consultation to GI consult for further assessment and management of any Esophageal dysmotility; Reflux activity  D. Therapy recommendations: NONE  E. Results and recommendations were discussed w/ patient; video viewed and questions answered            Dysphagia, pharyngoesophageal phase  Oropharyngeal dysphagia - Plan: DG OP Swallowing Func-Medicare/Speech Path, DG OP Swallowing Func-Medicare/Speech Path        Problem List Patient Active Problem List   Diagnosis Date Noted  . Hypothyroidism 04/28/2017  . Glaucoma 04/28/2017  . Vitamin D deficiency 04/28/2017  . COPD (chronic obstructive pulmonary disease) ([place]) 04/28/2017  . Chronic pain in left foot 04/28/2017  . Prediabetes 04/28/2017  . Osteoarthritis 04/28/2017  . Obesity (BMI 30.0-34.9) 04/28/2017      Orinda Kenner, MS, CCC-SLP Watson,Katherine 10/09/2017, 6:17 PM  [facility] [place], [place], [address] Phone: [phone]   Fax:     Name: ANICE WILSHIRE MRN: [id number] Date of Birth: 1946-04-14

## 2017-11-11 ENCOUNTER — Telehealth: Payer: Self-pay | Admitting: Family Medicine

## 2017-11-11 NOTE — Telephone Encounter (Signed)
Wants to discuss about who to go to as new pt. Please call her when you get a chance.

## 2017-11-15 ENCOUNTER — Other Ambulatory Visit: Payer: Self-pay

## 2017-11-15 ENCOUNTER — Ambulatory Visit
Admission: EM | Admit: 2017-11-15 | Discharge: 2017-11-15 | Disposition: A | Discharge status: Home / Self Care | Payer: Medicare Other | Attending: Family Medicine | Admitting: Family Medicine

## 2017-11-15 DIAGNOSIS — J441 Chronic obstructive pulmonary disease with (acute) exacerbation: Secondary | ICD-10-CM | POA: Diagnosis not present

## 2017-11-15 DIAGNOSIS — J069 Acute upper respiratory infection, unspecified: Secondary | ICD-10-CM | POA: Diagnosis not present

## 2017-11-15 DIAGNOSIS — B9789 Other viral agents as the cause of diseases classified elsewhere: Secondary | ICD-10-CM

## 2017-11-15 MED ORDER — PREDNISONE 10 MG PO TABS: ORAL_TABLET | ORAL | 0 refills | Status: DC

## 2017-11-15 MED ORDER — DOXYCYCLINE HYCLATE 100 MG PO CAPS: 100.0000 mg | ORAL_CAPSULE | Freq: Two times a day (BID) | ORAL | 0 refills | Status: DC

## 2017-11-15 MED ORDER — BENZONATATE 100 MG PO CAPS: 100.0000 mg | ORAL_CAPSULE | Freq: Three times a day (TID) | ORAL | 0 refills | Status: DC | PRN

## 2017-11-15 NOTE — ED Triage Notes (Signed)
Patient complains of cough, congestion, headache that started on Tuesday. Patient reports some low grade fevers throughout the week. Patient also complains of right ear pressure.

## 2017-11-15 NOTE — ED Provider Notes (Signed)
[facility] ____________________________________________  Time seen: Approximately 10:22 AM  I have reviewed the triage vital signs and the nursing notes.   HISTORY  Chief Complaint Cough  HPI Catherine Ray is a 72 y.o. female presenting for evaluation of approximately 5 days of cough and congestion.  States initially started out with more sneezing and nasal drainage that quickly led into cough.  States cough has been disrupting sleep.  States she has noticed intermittent wheezing and wheezing with cough.  Reports history of COPD.  Denies recent COPD exacerbation.  States she feels sore from coughing but denies chest pain or shortness of breath.  Has used over-the-counter congestion medications without much change.  Has also used home albuterol inhaler twice a day for the last 2 days with minimal changes.  Continues to eat and drink well.  Does have some seasonal allergies and has been recently outside, but also reports her grandchildren have been sick with some similar complaints.  Denies other aggravating or alleviating factors were present otherwise feels well. Denies chest pain, shortness of breath, abdominal pain, extremity pain, or rash. Denies recent sickness. Denies recent antibiotic use.  Denies cardiac history.  Denies diabetes.  Adline Potter, MD: PCP   Past Medical History:  Diagnosis Date  . COPD (chronic obstructive pulmonary disease) ([place])   . Squamous cell cancer of skin of forearm 2014  . Thyroid disease     Patient Active Problem List   Diagnosis Date Noted  . Hypothyroidism 04/28/2017  . Glaucoma 04/28/2017  . Vitamin D deficiency 04/28/2017  . COPD (chronic obstructive pulmonary disease) ([place]) 04/28/2017  . Chronic pain in left foot 04/28/2017  . Prediabetes 04/28/2017  . Osteoarthritis 04/28/2017  . Obesity (BMI 30.0-34.9) 04/28/2017    Past Surgical History:  Procedure Laterality Date  . BREAST CYST EXCISION Bilateral    neg  . FOOT  SURGERY    . KNEE SURGERY    . SQUAMOUS CELL CARCINOMA EXCISION     Arm  . TONSILLECTOMY       No current facility-administered medications for this encounter.   Current Outpatient Medications:  .  acetaminophen (TYLENOL) 500 MG tablet, Take 2 tablets by mouth 2 (two) times daily as needed., Disp: , Rfl:  .  albuterol (PROVENTIL HFA;VENTOLIN HFA) 108 (90 Base) MCG/ACT inhaler, Inhale 2 puffs into the lungs every 6 (six) hours as needed., Disp: , Rfl:  .  cholecalciferol (VITAMIN D) 1000 units tablet, Take 1,000 Units by mouth daily., Disp: , Rfl:  .  latanoprost (XALATAN) 0.005 % ophthalmic solution, 1 drop at bedtime., Disp: , Rfl:  .  levothyroxine (SYNTHROID, LEVOTHROID) 88 MCG tablet, Take 1 tablet (88 mcg total) by mouth daily before breakfast., Disp: 90 tablet, Rfl: 3 .  pseudoephedrine (SUDAFED) 30 MG tablet, Take 30 mg by mouth every 4 (four) hours as needed for congestion., Disp: , Rfl:  .  tiotropium (SPIRIVA) 18 MCG inhalation capsule, Place 18 mcg into inhaler and inhale daily., Disp: , Rfl:  .  benzonatate (TESSALON PERLES) 100 MG capsule, Take 1 capsule (100 mg total) by mouth 3 (three) times daily as needed for cough., Disp: 15 capsule, Rfl: 0 .  doxycycline (VIBRAMYCIN) 100 MG capsule, Take 1 capsule (100 mg total) by mouth 2 (two) times daily., Disp: 20 capsule, Rfl: 0 .  predniSONE (DELTASONE) 10 MG tablet, Start 60 mg po day one, then 50 mg po day two, taper by 10 mg daily until complete., Disp: 21 tablet, Rfl: 0  Allergies Codeine  Family History  Problem Relation Age of Onset  . Cancer Mother   . Hypertension Mother   . Diabetes Mother   . Breast cancer Mother 21  . Heart failure Father   . Hypertension Sister   . Diabetes Sister   . Cancer Sister   . Stroke Sister   . Breast cancer Sister 109    Social History Social History   Tobacco Use  . Smoking status: Former Smoker    Types: Cigarettes  . Smokeless tobacco: Never Used  Substance Use Topics  .  Alcohol use: No  . Drug use: No    Review of Systems Constitutional: No fever/chills ENT: reports some throat irritation Cardiovascular: Denies chest pain. Respiratory: Denies shortness of breath. As above. Gastrointestinal: No abdominal pain.   Genitourinary: Negative for dysuria. Musculoskeletal: Negative for back pain. Skin: Negative for rash.   ____________________________________________   PHYSICAL EXAM:  VITAL SIGNS: ED Triage Vitals  Enc Vitals Group     BP 11/15/17 0946 129/70     Pulse Rate 11/15/17 0946 92     Resp 11/15/17 0946 18     Temp 11/15/17 0946 99.1 F (37.3 C)     Temp Source 11/15/17 0946 Oral     SpO2 11/15/17 0946 96 %     Weight 11/15/17 0944 190 lb (86.2 kg)     Height 11/15/17 0944 5' 3.5" (1.613 m)     Head Circumference --      Peak Flow --      Pain Score 11/15/17 0944 5     Pain Loc --      Pain Edu? --      Excl. in [place]? --     Constitutional: Alert and oriented. Well appearing and in no acute distress. Eyes: Conjunctivae are normal. Head: Atraumatic. No sinus tenderness to palpation. No swelling. No erythema.  Ears: no erythema, normal TMs bilaterally.   Nose:Nasal congestion   Mouth/Throat: Mucous membranes are moist. No pharyngeal erythema. No tonsillar swelling or exudate.  Neck: No stridor.  No cervical spine tenderness to palpation. Hematological/Lymphatic/Immunilogical: No cervical lymphadenopathy. Cardiovascular: Normal rate, regular rhythm. Grossly normal heart sounds.  Good peripheral circulation. Respiratory: Normal respiratory effort.  No retractions. mild scattered wheezes. No rales or rhonchi.  Dry intermittent cough with bronchospasm.  Speaks in complete sentences.  Good air movement.  Gastrointestinal: Soft and nontender. Musculoskeletal: Ambulatory with steady gait.  Neurologic:  Normal speech and language. No gait instability. Skin:  Skin appears warm, dry and intact. No rash noted. Psychiatric: Mood and affect are  normal. Speech and behavior are normal.  ___________________________________________   LABS (all labs ordered are listed, but only abnormal results are displayed)  Labs Reviewed - No data to display ____________________________________________   PROCEDURES Procedures    INITIAL IMPRESSION / ASSESSMENT AND PLAN / ED COURSE  Pertinent labs & imaging results that were available during my care of the patient were reviewed by me and considered in my medical decision making (see chart for details).  Well-appearing patient.  No acute distress.  Suspect recent viral upper respiratory infection versus allergic rhinitis with secondary COPD exacerbation.  Will treat patient with prednisone taper, continue home albuterol treatment, PRN Tessalon Perles and oral doxycycline.  Encourage rest, fluids, supportive care.Discussed indication, risks and benefits of medications with patient.  Discussed follow up with Primary care physician this week. Discussed follow up and return parameters including no resolution or any worsening concerns. Patient verbalized understanding and agreed  to plan.   ____________________________________________   FINAL CLINICAL IMPRESSION(S) / ED DIAGNOSES  Final diagnoses:  Viral URI with cough  COPD exacerbation [facility])     ED Discharge Orders        Ordered    doxycycline (VIBRAMYCIN) 100 MG capsule  2 times daily     11/15/17 1039    predniSONE (DELTASONE) 10 MG tablet     11/15/17 1039    benzonatate (TESSALON PERLES) 100 MG capsule  3 times daily PRN     11/15/17 1039       Note: This dictation was prepared with Dragon dictation along with smaller phrase technology. Any transcriptional errors that result from this process are unintentional.         Marylene Land, NP 11/15/17 1101

## 2017-11-15 NOTE — Discharge Instructions (Addendum)
Take medication as prescribed. Rest. Drink plenty of fluids.  Continue to use home albuterol inhaler.  Follow up with your primary care physician this week as needed. Return to Urgent care for new or worsening concerns.

## 2017-11-25 ENCOUNTER — Encounter: Payer: Self-pay | Admitting: Internal Medicine

## 2017-11-25 ENCOUNTER — Ambulatory Visit (INDEPENDENT_AMBULATORY_CARE_PROVIDER_SITE_OTHER): Payer: Medicare Other | Admitting: Internal Medicine

## 2017-11-25 ENCOUNTER — Ambulatory Visit
Admission: RE | Admit: 2017-11-25 | Discharge: 2017-11-25 | Disposition: A | Discharge status: Home / Self Care | Payer: Medicare Other | Source: Ambulatory Visit | Attending: Internal Medicine | Admitting: Internal Medicine

## 2017-11-25 VITALS — BP 122/80 | HR 78 | Ht 63.5 in | Wt 187.0 lb

## 2017-11-25 DIAGNOSIS — S8001XA Contusion of right knee, initial encounter: Secondary | ICD-10-CM | POA: Diagnosis not present

## 2017-11-25 DIAGNOSIS — S92351A Displaced fracture of fifth metatarsal bone, right foot, initial encounter for closed fracture: Secondary | ICD-10-CM | POA: Insufficient documentation

## 2017-11-25 DIAGNOSIS — S9031XA Contusion of right foot, initial encounter: Secondary | ICD-10-CM

## 2017-11-25 DIAGNOSIS — W19XXXA Unspecified fall, initial encounter: Secondary | ICD-10-CM | POA: Diagnosis not present

## 2017-11-25 NOTE — Progress Notes (Signed)
Date:  11/25/2017   Name:  Catherine Ray   DOB:  04-06-1946   MRN:  [id number]   Chief Complaint: [place] Thursday when a throw blanket wrapped around legs. Fell on Rt knee and rt foot. Had surgery on Rt knee a few years ago- straightened knee cap and cleaned maniscus. Swelling is better today but still wanted checked out. Middle toes are bruised. )  Right knee and foot pain - fell at home 11/20/17 onto carpet.  She landed on both knees and twisted her right foot.  Both knees feel better, the right is still tender but does not hurt to walk or bend.  The right foot is less swollen but very tender along the lateral mid foot.  Review of Systems  Constitutional: Negative for chills, fatigue and fever.  Respiratory: Negative for chest tightness and shortness of breath.   Cardiovascular: Negative for chest pain and palpitations.  Musculoskeletal: Positive for arthralgias and gait problem.  Skin: Positive for color change.    Patient Active Problem List   Diagnosis Date Noted  . Hypothyroidism 04/28/2017  . Glaucoma 04/28/2017  . Vitamin D deficiency 04/28/2017  . COPD (chronic obstructive pulmonary disease) ([place]) 04/28/2017  . Chronic pain in left foot 04/28/2017  . Prediabetes 04/28/2017  . Osteoarthritis 04/28/2017  . Obesity (BMI 30.0-34.9) 04/28/2017    Prior to Admission medications   Medication Sig Start Date End Date Taking? Authorizing Provider  acetaminophen (TYLENOL) 500 MG tablet Take 2 tablets by mouth 2 (two) times daily as needed. 04/28/17  Yes [provider]  albuterol (PROVENTIL HFA;VENTOLIN HFA) 108 (90 Base) MCG/ACT inhaler Inhale 2 puffs into the lungs every 6 (six) hours as needed.   Yes [provider]  cholecalciferol (VITAMIN D) 1000 units tablet Take 1,000 Units by mouth daily.   Yes [provider]  dextromethorphan-guaiFENesin (MUCINEX DM) 30-600 MG 12hr tablet Take 1 tablet by mouth 2 (two) times daily.   Yes [provider]  latanoprost (XALATAN) 0.005 % ophthalmic solution 1 drop at bedtime.   Yes [provider]  levothyroxine (SYNTHROID, LEVOTHROID) 88 MCG tablet Take 1 tablet (88 mcg total) by mouth daily before breakfast. 07/02/17  Yes Plonk, Gwyndolyn Saxon, MD  pseudoephedrine (SUDAFED) 30 MG tablet Take 30 mg by mouth every 4 (four) hours as needed for congestion.   Yes [provider]  tiotropium (SPIRIVA) 18 MCG inhalation capsule Place 18 mcg into inhaler and inhale daily.   Yes [provider]    Allergies  Allergen Reactions  . Codeine Itching and Nausea Only    Past Surgical History:  Procedure Laterality Date  . BREAST CYST EXCISION Bilateral    neg  . FOOT SURGERY    . KNEE SURGERY    . SQUAMOUS CELL CARCINOMA EXCISION     Arm  . TONSILLECTOMY      Social History   Tobacco Use  . Smoking status: Former Smoker    Types: Cigarettes  . Smokeless tobacco: Never Used  Substance Use Topics  . Alcohol use: No  . Drug use: No     Medication list has been reviewed and updated.  PHQ 2/9 Scores 04/28/2017  PHQ - 2 Score 1    Physical Exam  Constitutional: She is oriented to person, place, and time. She appears well-developed. No distress.  HENT:  Head: Normocephalic and atraumatic.  Cardiovascular: Normal rate, regular rhythm and normal heart sounds.  Pulmonary/Chest: Effort normal and breath sounds normal. No  respiratory distress. She has no wheezes.  Musculoskeletal: Normal range of motion.       Right knee: She exhibits normal range of motion, no swelling and no effusion. No tenderness found.       Left knee: She exhibits normal range of motion, no swelling and no effusion. No tenderness found.       Feet:  Neurological: She is alert and oriented to person, place, and time.  Skin: Skin is warm and dry. No rash noted.  Psychiatric: She has a normal mood and affect. Her behavior is normal. Thought content normal.    BP 122/80   Pulse 78    Ht 5' 3.5" (1.613 m)   Wt 187 lb (84.8 kg)   SpO2 98%   BMI 32.61 kg/m   Assessment and Plan: 1. Contusion of right foot, initial encounter Xray to rule out fracture Has seen Dr. Vickki Muff so will refer if fracture unstable - DG Foot Complete Right; Future  2. Contusion of right knee, initial encounter Continue limited activity, tylenol as needed   No orders of the defined types were placed in this encounter.   Partially dictated using Editor, commissioning. Any errors are unintentional.  Halina Maidens, MD [facility]  11/25/2017

## 2017-11-26 ENCOUNTER — Other Ambulatory Visit: Payer: Self-pay | Admitting: Internal Medicine

## 2017-11-26 ENCOUNTER — Ambulatory Visit: Payer: Medicare Other | Admitting: Family Medicine

## 2017-11-26 DIAGNOSIS — S92901A Unspecified fracture of right foot, initial encounter for closed fracture: Secondary | ICD-10-CM | POA: Insufficient documentation

## 2017-11-26 DIAGNOSIS — S92902A Unspecified fracture of left foot, initial encounter for closed fracture: Secondary | ICD-10-CM

## 2017-12-01 ENCOUNTER — Ambulatory Visit: Payer: Medicare Other | Admitting: Internal Medicine

## 2018-01-19 ENCOUNTER — Other Ambulatory Visit: Payer: Self-pay | Admitting: Student

## 2018-01-19 DIAGNOSIS — M5412 Radiculopathy, cervical region: Secondary | ICD-10-CM

## 2018-01-19 DIAGNOSIS — R202 Paresthesia of skin: Secondary | ICD-10-CM

## 2018-01-19 DIAGNOSIS — R200 Anesthesia of skin: Secondary | ICD-10-CM

## 2018-01-31 ENCOUNTER — Ambulatory Visit
Admission: RE | Admit: 2018-01-31 | Discharge: 2018-01-31 | Disposition: A | Discharge status: Home / Self Care | Payer: Medicare Other | Source: Ambulatory Visit | Attending: Student | Admitting: Student

## 2018-01-31 DIAGNOSIS — R202 Paresthesia of skin: Secondary | ICD-10-CM | POA: Diagnosis present

## 2018-01-31 DIAGNOSIS — M4802 Spinal stenosis, cervical region: Secondary | ICD-10-CM | POA: Diagnosis not present

## 2018-01-31 DIAGNOSIS — M2578 Osteophyte, vertebrae: Secondary | ICD-10-CM | POA: Diagnosis not present

## 2018-01-31 DIAGNOSIS — R200 Anesthesia of skin: Secondary | ICD-10-CM | POA: Diagnosis present

## 2018-01-31 DIAGNOSIS — M5412 Radiculopathy, cervical region: Secondary | ICD-10-CM | POA: Diagnosis present

## 2018-10-06 ENCOUNTER — Other Ambulatory Visit: Payer: Self-pay

## 2018-10-06 MED ORDER — LEVOTHYROXINE SODIUM 88 MCG PO TABS: 88.0000 ug | ORAL_TABLET | Freq: Every day | ORAL | 0 refills | Status: DC

## 2018-10-20 ENCOUNTER — Ambulatory Visit: Payer: Medicare Other | Admitting: Internal Medicine

## 2019-05-03 ENCOUNTER — Other Ambulatory Visit: Payer: Self-pay | Admitting: Family Medicine

## 2019-05-03 DIAGNOSIS — Z1231 Encounter for screening mammogram for malignant neoplasm of breast: Secondary | ICD-10-CM

## 2019-05-18 ENCOUNTER — Ambulatory Visit
Admission: RE | Admit: 2019-05-18 | Discharge: 2019-05-18 | Disposition: A | Discharge status: Home / Self Care | Payer: Medicare Other | Source: Ambulatory Visit | Attending: Family Medicine | Admitting: Family Medicine

## 2019-05-18 ENCOUNTER — Other Ambulatory Visit: Payer: Self-pay

## 2019-05-18 DIAGNOSIS — Z1231 Encounter for screening mammogram for malignant neoplasm of breast: Secondary | ICD-10-CM | POA: Diagnosis not present

## 2019-06-15 ENCOUNTER — Other Ambulatory Visit: Payer: Self-pay | Admitting: Student

## 2019-06-15 ENCOUNTER — Telehealth: Payer: Self-pay | Admitting: *Deleted

## 2019-06-15 DIAGNOSIS — M7062 Trochanteric bursitis, left hip: Secondary | ICD-10-CM

## 2019-06-15 DIAGNOSIS — M67952 Unspecified disorder of synovium and tendon, left thigh: Secondary | ICD-10-CM

## 2019-06-23 ENCOUNTER — Ambulatory Visit
Admission: RE | Admit: 2019-06-23 | Discharge: 2019-06-23 | Disposition: A | Discharge status: Home / Self Care | Payer: Medicare Other | Source: Ambulatory Visit | Attending: Student | Admitting: Student

## 2019-06-23 ENCOUNTER — Other Ambulatory Visit: Payer: Self-pay

## 2019-06-23 DIAGNOSIS — M7062 Trochanteric bursitis, left hip: Secondary | ICD-10-CM | POA: Diagnosis present

## 2019-06-23 DIAGNOSIS — M67952 Unspecified disorder of synovium and tendon, left thigh: Secondary | ICD-10-CM | POA: Insufficient documentation

## 2019-09-28 ENCOUNTER — Other Ambulatory Visit: Payer: Medicare Other | Attending: Internal Medicine

## 2019-09-30 ENCOUNTER — Encounter: Admission: RE | Payer: Self-pay | Source: Home / Self Care

## 2019-09-30 ENCOUNTER — Ambulatory Visit: Admission: RE | Admit: 2019-09-30 | Payer: Medicare Other | Source: Home / Self Care | Admitting: Internal Medicine

## 2019-09-30 SURGERY — COLONOSCOPY WITH PROPOFOL
Anesthesia: General

## 2019-12-15 ENCOUNTER — Other Ambulatory Visit: Payer: Self-pay | Admitting: Surgery

## 2019-12-17 ENCOUNTER — Encounter
Admission: RE | Admit: 2019-12-17 | Discharge: 2019-12-17 | Disposition: A | Discharge status: Home / Self Care | Payer: Medicare Other | Source: Ambulatory Visit | Attending: Surgery | Admitting: Surgery

## 2019-12-17 ENCOUNTER — Other Ambulatory Visit: Payer: Self-pay

## 2019-12-17 NOTE — Patient Instructions (Signed)
Your procedure is scheduled on: 12/28/19 Report to [place]. To find out your arrival time please call [phone] between 1PM - 3PM on 12/27/19.  Remember: Instructions that are not followed completely may result in serious medical risk, up to and including death, or upon the discretion of your surgeon and anesthesiologist your surgery may need to be rescheduled.     _X__ 1. Do not eat food after midnight the night before your procedure.                 No gum chewing or hard candies. You may drink clear liquids up to 2 hours                 before you are scheduled to arrive for your surgery- DO not drink clear                 liquids within 2 hours of the start of your surgery.                 Clear Liquids include:  water, apple juice without pulp, clear carbohydrate                 drink such as Clearfast or Gatorade, Black Coffee or Tea (Do not add                 anything to coffee or tea). Diabetics water only  __X__2.  On the morning of surgery brush your teeth with toothpaste and water, you                 may rinse your mouth with mouthwash if you wish.  Do not swallow any              toothpaste of mouthwash.     _X__ 3.  No Alcohol for 24 hours before or after surgery.   _X__ 4.  Do Not Smoke or use e-cigarettes For 24 Hours Prior to Your Surgery.                 Do not use any chewable tobacco products for at least 6 hours prior to                 surgery.  ____  5.  Bring all medications with you on the day of surgery if instructed.   __X__  6.  Notify your doctor if there is any change in your medical condition      (cold, fever, infections).     Do not wear jewelry, make-up, hairpins, clips or nail polish. Do not wear lotions, powders, or perfumes.  Do not shave 48 hours prior to surgery. Men may shave face and neck. Do not bring valuables to the hospital.    [facility] is not responsible for any belongings or  valuables.  Contacts, dentures/partials or body piercings may not be worn into surgery. Bring a case for your contacts, glasses or hearing aids, a denture cup will be supplied. Leave your suitcase in the car. After surgery it may be brought to your room. For patients admitted to the hospital, discharge time is determined by your treatment team.   Patients discharged the day of surgery will not be allowed to drive home.   Please read over the following fact sheets that you were given:   MRSA Information  __X__ Take these medicines the morning of surgery with A SIP OF WATER:  1. levothyroxine (SYNTHROID) 125 MCG tablet  2.   3.   4.  5.  6.  ____ Fleet Enema (as directed)   __X__ Use CHG Soap/SAGE wipes as directed  __X__ Use inhalers on the day of surgery  ____ Stop metformin/Janumet/Farxiga 2 days prior to surgery    ____ Take 1/2 of usual insulin dose the night before surgery. No insulin the morning          of surgery.   ____ Stop Blood Thinners Coumadin/Plavix/Xarelto/Pleta/Pradaxa/Eliquis/Effient/Aspirin  on   Or contact your Surgeon, Cardiologist or Medical Doctor regarding  ability to stop your blood thinners  __X__ Stop Anti-inflammatories 7 days before surgery such as Advil, Ibuprofen, Motrin,  BC or Goodies Powder, Naprosyn, Naproxen, Aleve, Aspirin    __X__ Stop all herbal supplements, fish oil or vitamin E until after surgery.    ____ Bring C-Pap to the hospital.    ENSURE PRE SURGERY DRINK IS TO BE FINISHED 2 HOURS PRIOR TO YOUR ARRIVAL THE DAY OF SURGERY   How to Use an Incentive Spirometer An incentive spirometer is a tool that measures how well you are filling your lungs with each breath. Learning to take long, deep breaths using this tool can help you keep your lungs clear and active. This may help to reverse or lessen your chance of developing breathing (pulmonary) problems, especially infection. You may be asked to use a spirometer:  After a  surgery.  If you have a lung problem or a history of smoking.  After a long period of time when you have been unable to move or be active. If the spirometer includes an indicator to show the highest number that you have reached, your health care provider or respiratory therapist will help you set a goal. Keep a list (log) of your progress as told by your health care provider. What are the risks?  Breathing too quickly may cause dizziness or cause you to pass out. Take your time so you do not get dizzy or light-headed.  If you are in pain, you may need to take pain medicine before doing incentive spirometry. It is harder to take a deep breath if you are having pain. How to use your incentive spirometer  1. Sit up on the edge of your bed or on a chair. 2. Hold the incentive spirometer so that it is in an upright position. 3. Before you use the spirometer, breathe out normally. 4. Place the mouthpiece in your mouth. Make sure your lips are closed tightly around it. 5. Breathe in slowly and as deeply as you can through your mouth, causing the piston or the ball to rise toward the top of the chamber. 6. Hold your breath for 3-5 seconds, or for as long as possible. ? If the spirometer includes a coach indicator, use this to guide you in breathing. Slow down your breathing if the indicator goes above the marked areas. 7. Remove the mouthpiece from your mouth and breathe out normally. The piston or ball will return to the bottom of the chamber. 8. Rest for a few seconds, then repeat the steps 10 or more times. ? Take your time and take a few normal breaths between deep breaths so that you do not get dizzy or light-headed. ? Do this every 1-2 hours when you are awake. 9. If the spirometer includes a goal marker to show the highest number you have reached (best effort), use this as a goal to work toward during each repetition.  10. After each set of 10 deep breaths, cough a few times. This will help to  make sure that your lungs are clear. ? If you have an incision on your chest or abdomen from surgery, place a pillow or a rolled-up towel firmly against the incision when you cough. This can help to reduce pain from coughing. General tips  When you become able to get out of bed, walk around often and continue to cough to help clear your lungs.  Keep using the incentive spirometer until your health care provider says it is okay to stop using it. If you have been in the hospital, you may be told to keep using the spirometer at home. Contact a health care provider if:  You are having difficulty using the spirometer.  You have trouble using the spirometer as often as instructed.  Your pain medicine is not giving enough relief for you to use the spirometer as told.  You have a fever.  You develop shortness of breath. Get help right away if:  You develop a cough with bloody mucus from the lungs (bloody sputum).  You have fluid or blood coming from an incision site after you cough. Summary  An incentive spirometer is a tool that can help you learn to take long, deep breaths to keep your lungs clear and active.  You may be asked to use a spirometer after a surgery, if you have a lung problem or a history of smoking, or if you have been inactive for a long period of time.  Use your incentive spirometer as instructed every 1-2 hours while you are awake.  If you have an incision on your chest or abdomen, place a pillow or a rolled-up towel firmly against your incision when you cough. This will help to reduce pain. This information is not intended to replace advice given to you by your health care provider. Make sure you discuss any questions you have with your health care provider. Document Revised: 02/19/2019 Document Reviewed: 06/04/2017 Elsevier Patient Education  2020 [employer].

## 2019-12-20 ENCOUNTER — Other Ambulatory Visit: Payer: Self-pay

## 2019-12-20 ENCOUNTER — Encounter
Admission: RE | Admit: 2019-12-20 | Discharge: 2019-12-20 | Disposition: A | Discharge status: Home / Self Care | Payer: Medicare Other | Source: Ambulatory Visit | Attending: Surgery | Admitting: Surgery

## 2019-12-20 DIAGNOSIS — Z01810 Encounter for preprocedural cardiovascular examination: Secondary | ICD-10-CM | POA: Diagnosis not present

## 2019-12-20 DIAGNOSIS — Z01818 Encounter for other preprocedural examination: Secondary | ICD-10-CM | POA: Insufficient documentation

## 2019-12-20 LAB — CBC
HCT: 37.1 % (ref 36.0–46.0)
Hemoglobin: 12.5 g/dL (ref 12.0–15.0)
MCH: 30.1 pg (ref 26.0–34.0)
MCHC: 33.7 g/dL (ref 30.0–36.0)
MCV: 89.4 fL (ref 80.0–100.0)
Platelets: 301 10*3/uL (ref 150–400)
RBC: 4.15 MIL/uL (ref 3.87–5.11)
RDW: 13.2 % (ref 11.5–15.5)
WBC: 5.7 10*3/uL (ref 4.0–10.5)
nRBC: 0 % (ref 0.0–0.2)

## 2019-12-20 LAB — BASIC METABOLIC PANEL
Anion gap: 9 (ref 5–15)
BUN: 15 mg/dL (ref 8–23)
CO2: 27 mmol/L (ref 22–32)
Calcium: 9.1 mg/dL (ref 8.9–10.3)
Chloride: 104 mmol/L (ref 98–111)
Creatinine, Ser: 0.89 mg/dL (ref 0.44–1.00)
GFR calc Af Amer: >60 mL/min (ref >60)
GFR calc non Af Amer: >60 mL/min (ref >60)
Glucose, Bld: 110 mg/dL — ABNORMAL HIGH (ref 70–99)
Potassium: 4.1 mmol/L (ref 3.5–5.1)
Sodium: 140 mmol/L (ref 135–145)

## 2019-12-24 ENCOUNTER — Other Ambulatory Visit
Admission: RE | Admit: 2019-12-24 | Discharge: 2019-12-24 | Disposition: A | Discharge status: Home / Self Care | Payer: Medicare Other | Source: Ambulatory Visit | Attending: Surgery | Admitting: Surgery

## 2019-12-24 DIAGNOSIS — Z01812 Encounter for preprocedural laboratory examination: Secondary | ICD-10-CM | POA: Insufficient documentation

## 2019-12-24 DIAGNOSIS — Z20822 Contact with and (suspected) exposure to covid-19: Secondary | ICD-10-CM | POA: Insufficient documentation

## 2019-12-24 LAB — SARS CORONAVIRUS 2 (TAT 6-24 HRS): SARS Coronavirus 2: NEGATIVE

## 2019-12-28 ENCOUNTER — Ambulatory Visit
Admission: RE | Admit: 2019-12-28 | Discharge: 2019-12-29 | Disposition: A | Discharge status: Home / Self Care | Payer: Medicare Other | Attending: Surgery | Admitting: Surgery

## 2019-12-28 ENCOUNTER — Encounter: Payer: Self-pay | Admitting: Surgery

## 2019-12-28 ENCOUNTER — Ambulatory Visit: Payer: Medicare Other | Admitting: Anesthesiology

## 2019-12-28 ENCOUNTER — Encounter
Admission: RE | Disposition: A | Discharge status: Home / Self Care | Payer: Self-pay | Source: Home / Self Care | Attending: Surgery

## 2019-12-28 ENCOUNTER — Other Ambulatory Visit: Payer: Self-pay

## 2019-12-28 DIAGNOSIS — Z79899 Other long term (current) drug therapy: Secondary | ICD-10-CM | POA: Insufficient documentation

## 2019-12-28 DIAGNOSIS — E669 Obesity, unspecified: Secondary | ICD-10-CM | POA: Insufficient documentation

## 2019-12-28 DIAGNOSIS — E039 Hypothyroidism, unspecified: Secondary | ICD-10-CM | POA: Insufficient documentation

## 2019-12-28 DIAGNOSIS — M1990 Unspecified osteoarthritis, unspecified site: Secondary | ICD-10-CM | POA: Diagnosis not present

## 2019-12-28 DIAGNOSIS — Z79890 Hormone replacement therapy (postmenopausal): Secondary | ICD-10-CM | POA: Insufficient documentation

## 2019-12-28 DIAGNOSIS — S76012A Strain of muscle, fascia and tendon of left hip, initial encounter: Secondary | ICD-10-CM | POA: Diagnosis present

## 2019-12-28 DIAGNOSIS — E559 Vitamin D deficiency, unspecified: Secondary | ICD-10-CM | POA: Diagnosis not present

## 2019-12-28 DIAGNOSIS — G8929 Other chronic pain: Secondary | ICD-10-CM | POA: Insufficient documentation

## 2019-12-28 DIAGNOSIS — R7303 Prediabetes: Secondary | ICD-10-CM | POA: Diagnosis not present

## 2019-12-28 DIAGNOSIS — J449 Chronic obstructive pulmonary disease, unspecified: Secondary | ICD-10-CM | POA: Diagnosis not present

## 2019-12-28 DIAGNOSIS — Z87891 Personal history of nicotine dependence: Secondary | ICD-10-CM | POA: Insufficient documentation

## 2019-12-28 DIAGNOSIS — H409 Unspecified glaucoma: Secondary | ICD-10-CM | POA: Diagnosis not present

## 2019-12-28 DIAGNOSIS — X58XXXA Exposure to other specified factors, initial encounter: Secondary | ICD-10-CM | POA: Diagnosis not present

## 2019-12-28 DIAGNOSIS — Z8249 Family history of ischemic heart disease and other diseases of the circulatory system: Secondary | ICD-10-CM | POA: Insufficient documentation

## 2019-12-28 DIAGNOSIS — Z85828 Personal history of other malignant neoplasm of skin: Secondary | ICD-10-CM | POA: Diagnosis not present

## 2019-12-28 HISTORY — PX: QUADRICEPS TENDON REPAIR: SHX756

## 2019-12-28 SURGERY — REPAIR, TENDON, QUADRICEPS
Anesthesia: General | Site: Hip | Laterality: Left

## 2019-12-28 MED ORDER — OXYCODONE HCL 5 MG PO TABS: 5.0000 mg | ORAL_TABLET | ORAL | Status: DC | PRN

## 2019-12-28 MED ORDER — CEFAZOLIN SODIUM-DEXTROSE 2-4 GM/100ML-% IV SOLN
2.0000 g | Freq: Four times a day (QID) | INTRAVENOUS | Status: AC
  Administered 2019-12-28 – 2019-12-29 (×3): 2 g via INTRAVENOUS
  Filled 2019-12-28 (×3): qty 100

## 2019-12-28 MED ORDER — LACTATED RINGERS IV SOLN: INTRAVENOUS | Status: DC

## 2019-12-28 MED ORDER — LATANOPROST 0.005 % OP SOLN
1.0000 [drp] | Freq: Every day | OPHTHALMIC | Status: DC
  Filled 2019-12-28: qty 2.5

## 2019-12-28 MED ORDER — METOCLOPRAMIDE HCL 5 MG/ML IJ SOLN: 5.0000 mg | Freq: Three times a day (TID) | INTRAMUSCULAR | Status: DC | PRN

## 2019-12-28 MED ORDER — ACETAMINOPHEN 10 MG/ML IV SOLN
INTRAVENOUS | Status: AC
  Filled 2019-12-28: qty 100

## 2019-12-28 MED ORDER — ROCURONIUM BROMIDE 100 MG/10ML IV SOLN
INTRAVENOUS | Status: DC | PRN
  Administered 2019-12-28: 20 mg via INTRAVENOUS
  Administered 2019-12-28: 50 mg via INTRAVENOUS

## 2019-12-28 MED ORDER — LIDOCAINE HCL (CARDIAC) PF 100 MG/5ML IV SOSY
PREFILLED_SYRINGE | INTRAVENOUS | Status: DC | PRN
  Administered 2019-12-28: 80 mg via INTRAVENOUS

## 2019-12-28 MED ORDER — SUGAMMADEX SODIUM 200 MG/2ML IV SOLN
INTRAVENOUS | Status: DC | PRN
  Administered 2019-12-28: 326.4 mg via INTRAVENOUS

## 2019-12-28 MED ORDER — GABAPENTIN 300 MG PO CAPS
300.0000 mg | ORAL_CAPSULE | Freq: Every day | ORAL | Status: DC
  Administered 2019-12-28: 300 mg via ORAL
  Filled 2019-12-28 (×2): qty 1

## 2019-12-28 MED ORDER — BUPIVACAINE LIPOSOME 1.3 % IJ SUSP
INTRAMUSCULAR | Status: AC
  Filled 2019-12-28: qty 20

## 2019-12-28 MED ORDER — CEFAZOLIN SODIUM-DEXTROSE 2-4 GM/100ML-% IV SOLN
2.0000 g | INTRAVENOUS | Status: AC
  Administered 2019-12-28: 2 g via INTRAVENOUS

## 2019-12-28 MED ORDER — POTASSIUM CHLORIDE IN NACL 20-0.9 MEQ/L-% IV SOLN
INTRAVENOUS | Status: DC
  Filled 2019-12-28 (×5): qty 1000

## 2019-12-28 MED ORDER — ONDANSETRON 4 MG PO TBDP
4.0000 mg | ORAL_TABLET | Freq: Three times a day (TID) | ORAL | 1 refills | Status: DC | PRN
Start: 2019-12-28 — End: 2021-01-21

## 2019-12-28 MED ORDER — ACETAMINOPHEN 500 MG PO TABS
1000.0000 mg | ORAL_TABLET | Freq: Four times a day (QID) | ORAL | Status: AC
  Administered 2019-12-28 – 2019-12-29 (×4): 1000 mg via ORAL
  Filled 2019-12-28 (×4): qty 2

## 2019-12-28 MED ORDER — ONDANSETRON HCL 4 MG/2ML IJ SOLN
4.0000 mg | Freq: Once | INTRAMUSCULAR | Status: AC
  Administered 2019-12-28: 4 mg via INTRAVENOUS

## 2019-12-28 MED ORDER — MAGNESIUM HYDROXIDE 400 MG/5ML PO SUSP: 30.0000 mL | Freq: Every day | ORAL | Status: DC | PRN

## 2019-12-28 MED ORDER — FENTANYL CITRATE (PF) 100 MCG/2ML IJ SOLN
INTRAMUSCULAR | Status: AC
  Filled 2019-12-28: qty 2

## 2019-12-28 MED ORDER — METOCLOPRAMIDE HCL 5 MG/ML IJ SOLN
5.0000 mg | Freq: Three times a day (TID) | INTRAMUSCULAR | Status: DC | PRN
Start: 2019-12-28 — End: 2019-12-28

## 2019-12-28 MED ORDER — FENTANYL CITRATE (PF) 100 MCG/2ML IJ SOLN
INTRAMUSCULAR | Status: AC
  Administered 2019-12-28: 25 ug via INTRAVENOUS
  Filled 2019-12-28: qty 2

## 2019-12-28 MED ORDER — METOCLOPRAMIDE HCL 10 MG PO TABS
5.0000 mg | ORAL_TABLET | Freq: Three times a day (TID) | ORAL | Status: DC | PRN
Start: 2019-12-28 — End: 2019-12-28

## 2019-12-28 MED ORDER — TRAMADOL HCL 50 MG PO TABS
50.0000 mg | ORAL_TABLET | Freq: Four times a day (QID) | ORAL | Status: DC | PRN
  Administered 2019-12-29: 50 mg via ORAL
  Filled 2019-12-28: qty 1

## 2019-12-28 MED ORDER — OXYCODONE HCL 5 MG PO TABS: 5.0000 mg | ORAL_TABLET | Freq: Once | ORAL | Status: DC | PRN

## 2019-12-28 MED ORDER — DIPHENHYDRAMINE HCL 12.5 MG/5ML PO ELIX: 12.5000 mg | ORAL_SOLUTION | ORAL | Status: DC | PRN

## 2019-12-28 MED ORDER — CALCIUM CARBONATE-VITAMIN D 500-200 MG-UNIT PO TABS
1.0000 | ORAL_TABLET | Freq: Every day | ORAL | Status: DC
  Administered 2019-12-29: 1 via ORAL
  Filled 2019-12-28: qty 1

## 2019-12-28 MED ORDER — DOCUSATE SODIUM 100 MG PO CAPS
100.0000 mg | ORAL_CAPSULE | Freq: Two times a day (BID) | ORAL | Status: DC
  Administered 2019-12-28 – 2019-12-29 (×2): 100 mg via ORAL
  Filled 2019-12-28 (×2): qty 1

## 2019-12-28 MED ORDER — ALBUTEROL SULFATE HFA 108 (90 BASE) MCG/ACT IN AERS: 2.0000 | INHALATION_SPRAY | Freq: Four times a day (QID) | RESPIRATORY_TRACT | Status: DC | PRN

## 2019-12-28 MED ORDER — PROPOFOL 10 MG/ML IV BOLUS
INTRAVENOUS | Status: DC | PRN
  Administered 2019-12-28: 30 mg via INTRAVENOUS
  Administered 2019-12-28: 150 mg via INTRAVENOUS

## 2019-12-28 MED ORDER — PROPOFOL 500 MG/50ML IV EMUL
INTRAVENOUS | Status: AC
  Filled 2019-12-28: qty 50

## 2019-12-28 MED ORDER — ACETAMINOPHEN 10 MG/ML IV SOLN
INTRAVENOUS | Status: DC | PRN
  Administered 2019-12-28: 1000 mg via INTRAVENOUS

## 2019-12-28 MED ORDER — FENTANYL CITRATE (PF) 100 MCG/2ML IJ SOLN
25.0000 ug | INTRAMUSCULAR | Status: AC | PRN
  Administered 2019-12-28 (×4): 25 ug via INTRAVENOUS

## 2019-12-28 MED ORDER — KETOROLAC TROMETHAMINE 15 MG/ML IJ SOLN
INTRAMUSCULAR | Status: AC
  Filled 2019-12-28: qty 1

## 2019-12-28 MED ORDER — DEXAMETHASONE SODIUM PHOSPHATE 10 MG/ML IJ SOLN
INTRAMUSCULAR | Status: DC | PRN
  Administered 2019-12-28: 10 mg via INTRAVENOUS

## 2019-12-28 MED ORDER — FAMOTIDINE 20 MG PO TABS
ORAL_TABLET | ORAL | Status: AC
  Administered 2019-12-28: 20 mg via ORAL
  Filled 2019-12-28: qty 1

## 2019-12-28 MED ORDER — METOCLOPRAMIDE HCL 10 MG PO TABS: 5.0000 mg | ORAL_TABLET | Freq: Three times a day (TID) | ORAL | Status: DC | PRN

## 2019-12-28 MED ORDER — LIDOCAINE HCL (PF) 2 % IJ SOLN
INTRAMUSCULAR | Status: AC
  Filled 2019-12-28: qty 5

## 2019-12-28 MED ORDER — KETOROLAC TROMETHAMINE 15 MG/ML IJ SOLN
7.5000 mg | Freq: Four times a day (QID) | INTRAMUSCULAR | Status: AC
  Administered 2019-12-28 – 2019-12-29 (×4): 7.5 mg via INTRAVENOUS
  Filled 2019-12-28 (×4): qty 1

## 2019-12-28 MED ORDER — CEFAZOLIN SODIUM-DEXTROSE 2-4 GM/100ML-% IV SOLN
INTRAVENOUS | Status: AC
  Filled 2019-12-28: qty 100

## 2019-12-28 MED ORDER — KETOROLAC TROMETHAMINE 30 MG/ML IJ SOLN
INTRAMUSCULAR | Status: DC | PRN
  Administered 2019-12-28: 30 mg via INTRAVENOUS

## 2019-12-28 MED ORDER — MEPERIDINE HCL 50 MG/ML IJ SOLN
6.2500 mg | INTRAMUSCULAR | Status: DC | PRN
  Administered 2019-12-28: 12.5 mg via INTRAVENOUS

## 2019-12-28 MED ORDER — OXYCODONE HCL 5 MG/5ML PO SOLN: 5.0000 mg | Freq: Once | ORAL | Status: DC | PRN

## 2019-12-28 MED ORDER — ONDANSETRON HCL 4 MG PO TABS: 4.0000 mg | ORAL_TABLET | Freq: Four times a day (QID) | ORAL | Status: DC | PRN

## 2019-12-28 MED ORDER — FENTANYL CITRATE (PF) 100 MCG/2ML IJ SOLN
25.0000 ug | Freq: Once | INTRAMUSCULAR | Status: AC
  Administered 2019-12-28: 25 ug via INTRAVENOUS

## 2019-12-28 MED ORDER — BUPIVACAINE HCL (PF) 0.5 % IJ SOLN
INTRAMUSCULAR | Status: AC
  Filled 2019-12-28: qty 30

## 2019-12-28 MED ORDER — MIDAZOLAM HCL 2 MG/2ML IJ SOLN
INTRAMUSCULAR | Status: AC
  Filled 2019-12-28: qty 2

## 2019-12-28 MED ORDER — ENOXAPARIN SODIUM 40 MG/0.4ML ~~LOC~~ SOLN
40.0000 mg | SUBCUTANEOUS | Status: DC
  Administered 2019-12-29: 40 mg via SUBCUTANEOUS
  Filled 2019-12-28: qty 0.4

## 2019-12-28 MED ORDER — ONDANSETRON HCL 4 MG/2ML IJ SOLN
INTRAMUSCULAR | Status: AC
  Filled 2019-12-28: qty 2

## 2019-12-28 MED ORDER — MEPERIDINE HCL 50 MG/ML IJ SOLN
INTRAMUSCULAR | Status: AC
  Filled 2019-12-28: qty 1

## 2019-12-28 MED ORDER — SODIUM CHLORIDE FLUSH 0.9 % IV SOLN
INTRAVENOUS | Status: AC
  Filled 2019-12-28: qty 40

## 2019-12-28 MED ORDER — TIOTROPIUM BROMIDE MONOHYDRATE 18 MCG IN CAPS
18.0000 ug | ORAL_CAPSULE | Freq: Every day | RESPIRATORY_TRACT | Status: DC
  Administered 2019-12-29: 18 ug via RESPIRATORY_TRACT
  Filled 2019-12-28: qty 5

## 2019-12-28 MED ORDER — OXYCODONE HCL 5 MG PO TABS: 5.0000 mg | ORAL_TABLET | ORAL | 0 refills | Status: DC | PRN

## 2019-12-28 MED ORDER — LEVOTHYROXINE SODIUM 25 MCG PO TABS
125.0000 ug | ORAL_TABLET | Freq: Every day | ORAL | Status: DC
  Administered 2019-12-29: 125 ug via ORAL
  Filled 2019-12-28: qty 1

## 2019-12-28 MED ORDER — FENTANYL CITRATE (PF) 100 MCG/2ML IJ SOLN
INTRAMUSCULAR | Status: DC | PRN
  Administered 2019-12-28 (×3): 50 ug via INTRAVENOUS
  Administered 2019-12-28: 25 ug via INTRAVENOUS
  Administered 2019-12-28: 50 ug via INTRAVENOUS
  Administered 2019-12-28: 25 ug via INTRAVENOUS

## 2019-12-28 MED ORDER — SODIUM CHLORIDE 0.9 % IV SOLN
INTRAVENOUS | Status: DC | PRN
  Administered 2019-12-28: 70 mL

## 2019-12-28 MED ORDER — SODIUM CHLORIDE 0.9 % IV SOLN: INTRAVENOUS | Status: DC

## 2019-12-28 MED ORDER — KETOROLAC TROMETHAMINE 15 MG/ML IJ SOLN: 15.0000 mg | Freq: Once | INTRAMUSCULAR | Status: DC

## 2019-12-28 MED ORDER — ONDANSETRON HCL 4 MG/2ML IJ SOLN: 4.0000 mg | Freq: Four times a day (QID) | INTRAMUSCULAR | Status: DC | PRN

## 2019-12-28 MED ORDER — FAMOTIDINE 20 MG PO TABS: 20.0000 mg | ORAL_TABLET | Freq: Once | ORAL | Status: AC

## 2019-12-28 MED ORDER — DIPHENHYDRAMINE HCL 25 MG PO CAPS
25.0000 mg | ORAL_CAPSULE | Freq: Four times a day (QID) | ORAL | Status: DC
  Administered 2019-12-29: 25 mg via ORAL
  Filled 2019-12-28 (×3): qty 1

## 2019-12-28 MED ORDER — ONDANSETRON HCL 4 MG/2ML IJ SOLN
4.0000 mg | Freq: Four times a day (QID) | INTRAMUSCULAR | Status: DC | PRN
Start: 2019-12-28 — End: 2019-12-28

## 2019-12-28 MED ORDER — ONDANSETRON HCL 4 MG/2ML IJ SOLN
INTRAMUSCULAR | Status: DC | PRN
  Administered 2019-12-28: 4 mg via INTRAVENOUS

## 2019-12-28 MED ORDER — ONDANSETRON HCL 4 MG PO TABS
4.0000 mg | ORAL_TABLET | Freq: Four times a day (QID) | ORAL | Status: DC | PRN
Start: 2019-12-28 — End: 2019-12-28

## 2019-12-28 MED ORDER — ALBUTEROL SULFATE (2.5 MG/3ML) 0.083% IN NEBU: 2.5000 mg | INHALATION_SOLUTION | Freq: Four times a day (QID) | RESPIRATORY_TRACT | Status: DC | PRN

## 2019-12-28 MED ORDER — BISACODYL 10 MG RE SUPP: 10.0000 mg | Freq: Every day | RECTAL | Status: DC | PRN

## 2019-12-28 MED ORDER — PROMETHAZINE HCL 25 MG/ML IJ SOLN: 6.2500 mg | INTRAMUSCULAR | Status: DC | PRN

## 2019-12-28 MED ORDER — TRAMADOL HCL 50 MG PO TABS
50.0000 mg | ORAL_TABLET | Freq: Four times a day (QID) | ORAL | Status: DC | PRN
Start: 2019-12-28 — End: 2019-12-28

## 2019-12-28 MED ORDER — FLEET ENEMA 7-19 GM/118ML RE ENEM: 1.0000 | ENEMA | Freq: Once | RECTAL | Status: DC | PRN

## 2019-12-28 SURGICAL SUPPLY — 49 items
ANCHOR HEALICOIL REGEN 5.5 (Anchor) ×4 IMPLANT
ANCHOR SUT 5.0 TWINFIX 2 ULBRD (Anchor) ×6 IMPLANT
BLADE SURG SZ10 CARB STEEL (BLADE) ×6 IMPLANT
BNDG COHESIVE 4X5 TAN STRL (GAUZE/BANDAGES/DRESSINGS) ×3 IMPLANT
BNDG ESMARK 6X12 TAN STRL LF (GAUZE/BANDAGES/DRESSINGS) ×1 IMPLANT
CANISTER SUCT 1200ML W/VALVE (MISCELLANEOUS) ×3 IMPLANT
CHLORAPREP W/TINT 26 (MISCELLANEOUS) ×3 IMPLANT
COVER WAND RF STERILE (DRAPES) ×3 IMPLANT
CUFF TOURN 24 STER (MISCELLANEOUS) IMPLANT
CUFF TOURN 30 STER DUAL PORT (MISCELLANEOUS) IMPLANT
DILATOR 5.5 THREADED HEALICOIL (MISCELLANEOUS) ×2 IMPLANT
DRAPE 3/4 80X56 (DRAPES) ×3 IMPLANT
DRAPE INCISE IOBAN 66X45 STRL (DRAPES) ×3 IMPLANT
DRAPE SPLIT 6X30 W/TAPE (DRAPES) ×6 IMPLANT
DRAPE SURG 17X11 SM STRL (DRAPES) ×6 IMPLANT
DRSG OPSITE POSTOP 4X10 (GAUZE/BANDAGES/DRESSINGS) ×2 IMPLANT
ELECT REM PT RETURN 9FT ADLT (ELECTROSURGICAL) ×3
ELECTRODE REM PT RTRN 9FT ADLT (ELECTROSURGICAL) ×1 IMPLANT
GAUZE SPONGE 4X4 12PLY STRL (GAUZE/BANDAGES/DRESSINGS) ×1 IMPLANT
GAUZE XEROFORM 1X8 LF (GAUZE/BANDAGES/DRESSINGS) ×1 IMPLANT
GLOVE BIO SURGEON STRL SZ8 (GLOVE) ×6 IMPLANT
GLOVE INDICATOR 8.0 STRL GRN (GLOVE) ×3 IMPLANT
GOWN STRL REUS W/ TWL LRG LVL3 (GOWN DISPOSABLE) ×2 IMPLANT
GOWN STRL REUS W/ TWL XL LVL3 (GOWN DISPOSABLE) ×1 IMPLANT
GOWN STRL REUS W/TWL LRG LVL3 (GOWN DISPOSABLE) ×4
GOWN STRL REUS W/TWL XL LVL3 (GOWN DISPOSABLE) ×2
HANDLE YANKAUER SUCT BULB TIP (MISCELLANEOUS) ×3 IMPLANT
IMMBOLIZER KNEE 19 BLUE UNIV (SOFTGOODS) ×1 IMPLANT
KIT TURNOVER KIT A (KITS) ×3 IMPLANT
NDL MAYO CATGUT SZ1 (NEEDLE) ×3 IMPLANT
NEEDLE MAYO CATGUT SZ1 (NEEDLE) ×1 IMPLANT
NS IRRIG 1000ML POUR BTL (IV SOLUTION) ×3 IMPLANT
PACK HIP PROSTHESIS (MISCELLANEOUS) ×2 IMPLANT
PAD CAST CTTN 4X4 STRL (SOFTGOODS) ×1 IMPLANT
PADDING CAST COTTON 4X4 STRL (SOFTGOODS)
RETRIEVER SUT HEWSON (MISCELLANEOUS) ×1 IMPLANT
SPONGE LAP 18X18 RF (DISPOSABLE) ×3 IMPLANT
STAPLER SKIN PROX 35W (STAPLE) ×3 IMPLANT
STOCKINETTE IMPERVIOUS 9X36 MD (GAUZE/BANDAGES/DRESSINGS) ×3 IMPLANT
SUT ETHIBOND #5 BRAIDED 30INL (SUTURE) ×1 IMPLANT
SUT FIBERWIRE #2 38 BLUE 1/2 (SUTURE) ×3
SUT VIC AB 0 CT1 36 (SUTURE) ×6 IMPLANT
SUT VIC AB 2-0 CT1 (SUTURE) ×2 IMPLANT
SUT VIC AB 2-0 CT1 27 (SUTURE) ×6
SUT VIC AB 2-0 CT1 TAPERPNT 27 (SUTURE) ×2 IMPLANT
SUT VIC AB 2-0 CT2 27 (SUTURE) ×4 IMPLANT
SUT VIC AB 3-0 SH 27 (SUTURE)
SUT VIC AB 3-0 SH 27X BRD (SUTURE) ×2 IMPLANT
SUTURE FIBERWR #2 38 BLUE 1/2 (SUTURE) IMPLANT

## 2019-12-28 NOTE — Op Note (Signed)
12/28/2019  3:16 PM  Patient:   Catherine Ray  Pre-Op Diagnosis:   Partial tear of left gluteus medius tendon.  Post-Op Diagnosis:   Same  Procedure:   Primary repair of near full-thickness left gluteus medius tendon tear.  Surgeon:   Pascal Lux, MD  Assistant:   Cameron Proud, PA-C  Anesthesia:   GET  Findings:   As above.  Complications:   None  Fluids:   800 cc crystalloid  EBL:   10 cc  UOP:   None  TT:   None  Drains:   None  Closure:   Staples  Implants:   Smith & Nephew TwinFix titanium 5 mm suture anchors x3, Smith & Nephew 5.5 mm Healicoil knotless Regenesorb anchors x2  Brief Clinical Note:   The patient is a 74 year old female with a several year history of lateral sided left hip pain. Her symptoms have progressed despite medications, activity modification, injections, physical therapy, etc. An MRI scan demonstrated the presence of a partial-thickness tear involving the gluteus medius tendon. The patient presents at this time for primary repair of the partial-thickness gluteus medius tendon tear of her left hip.  Procedure:   The patient was brought into the operating room and lain in the supine position. After adequate general endotracheal intubation and anesthesia was obtained, the patient was repositioned in the right lateral decubitus position and secured using a lateral hip positioner. The left hip and lower extremity were prepped with ChloroPrep solution before being draped sterilely. Preoperative antibiotics were administered. A timeout was performed to verify the appropriate surgical site before a standard posterior approach to the hip was made through an approximately 4-5 inch incision. The incision was carried down through the subcutaneous tissues to expose the gluteal fascia and proximal end of the iliotibial band. These structures were split the length of the incision and the Charnley self-retaining hip retractor placed. The bursal tissues were swept  posteriorly to expose the gluteus medius tendon and muscle.    Palpation demonstrated a soft area involving the midportion of the insertional fibers of the gluteus medius tendon. This was incised longitudinally and the long lines of the fibers and several cc of clear fluid was encountered, consistent with her history of a partial thickness tear of this tendon. The degenerative margins of the torn tendon were debrided sharply before the exposed portions of the greater trochanter were lightly roughened with a rongeur to optimize bone healing. Three Smith & Nephew TwinFix titanium 5 mm suture anchors were placed along the superior and posterosuperior portions of the greater trochanter before each of the sutures were passed through the torn margin of the gluteus medius tendon and tied securely with the leg supported on a Mayo stand to reduce tension on the repair. The longitudinal incision within that the gluteus medius tendon was repaired using several interrupted #2 FiberWire interrupted sutures. Each of the sutures from the previously placed anchors were brought back distally and secured using two Smith & Nephew 5.5 mm Healicoil knotless Regenesorb anchors to complete the repair. An apparent anatomic repair was accomplished.  The wound was copiously irrigated with sterile saline solution before the peri-incisional and peri-tendinous tissues were injected with 30 cc of 0.5% Sensorcaine with epinephrine and 20 cc of Exparel diluted out to 40 cc with normal saline to help with postoperative analgesia. The iliotibial band was reapproximated using #0 Vicryl interrupted sutures. The gluteal fascia also was closed using a running #0 Vicryl interrupted suture. The subcutaneous tissues  were closed in several layers using 2-0 Vicryl interrupted sutures before the skin was closed using staples. A sterile occlusive dressing was applied to the wound. The patient then was placed into a hip abductor brace with the hip hinge set  at 20 to 70 degrees of hip flexion. The patient then was rolled back into the supine position on her stretcher before she was awakened, extubated, and returned to the recovery room in satisfactory condition after tolerating the procedure well.

## 2019-12-28 NOTE — Transfer of Care (Signed)
Immediate Anesthesia Transfer of Care Note  Patient: Catherine Ray  Procedure(s) Performed: Procedure(s): REPAIR OF LEFT GLUTEUS MEDIUS TENDON (Left)  Patient Location: PACU  Anesthesia Type:General  Level of Consciousness: sedated  Airway & Oxygen Therapy: Patient Spontanous Breathing and Patient connected to face mask oxygen  Post-op Assessment: Report given to RN and Post -op Vital signs reviewed and stable  Post vital signs: Reviewed and stable  Last Vitals:  Vitals:   12/28/19 1615 12/28/19 1630  BP: (!) 146/70 (!) 147/71  Pulse: 85 90  Resp: 12 19  Temp:    SpO2: [id number]    Complications: No apparent anesthesia complications

## 2019-12-28 NOTE — H&P (Signed)
History of Present Illness:  Catherine Ray is a 74 y.o. female who presents for follow-up of her lateral sided left hip pain secondary to an MRI-documented partial tear of the gluteus medius tendon. The patient was last seen for the symptoms 5 months ago. At this visit, the patient considered surgical intervention, but later decided to hold off on proceeding with surgery. Over the past few months, the patient has noted worsening of her lateral sided left hip pain. She has begun ambulating with a cane in her right hand to try to help offload the leg. She denies any pain at rest, but when she does ambulate, she describes pain in her left hip which she rates at 4/10. She has been taking Tylenol as necessary with limited benefit. She is quite frustrated by her persistent/recurrent symptoms, and is ready to consider more aggressive treatment options.  Current Outpatient Medications: . acetaminophen (TYLENOL) 500 MG tablet Take 500 mg by mouth every 6 (six) hours as needed  . albuterol 90 mcg/actuation inhaler Inhale 2 inhalations into the lungs every 6 (six) hours as needed for Wheezing PATIENT NEEDS APPOINTMENT FOR ADDITIONAL REFILLS 1 Inhaler 0  . alendronate (FOSAMAX) 70 MG tablet TAKE 1 TABLET(70 MG) BY MOUTH EVERY 7 DAYS WITH A FULL GLASS OF WATER. DO NOT LIE DOWN FOR THE NEXT 30 MINUTES 12 tablet 1  . calcium carbonate-vitamin D3 (CALTRATE 600+D) 600 mg(1,500mg ) -400 unit tablet Take 1 tablet by mouth 2 (two) times daily with meals 60 tablet 11  . latanoprost (XALATAN) 0.005 % ophthalmic solution 1 drop at bedtime.  Marland Kitchen levothyroxine (SYNTHROID) 112 MCG tablet Take 112 mcg by mouth as directed (Saturday and Sunday) Take on an empty stomach with a glass of water at least 30-60 minutes before breakfast.  . levothyroxine (SYNTHROID) 125 MCG tablet Take 1 tablet (125 mcg total) by mouth once daily Take on an empty stomach with a glass of water at least 30-60 minutes before breakfast. (Patient taking differently:  Take 125 mcg by mouth as directed (Monday through Friday) Take on an empty stomach with a glass of water at least 30-60 minutes before breakfast. ) 30 tablet 3  . pregabalin (LYRICA) 50 MG capsule Take 1 capsule (50 mg total) by mouth 2 (two) times daily 60 capsule 3  . SPIRIVA WITH HANDIHALER 18 mcg inhalation capsule INHALE THE CONTENTS OF 1 CAPSULE VIA INHALATION DEVICE DAILY 90 capsule 3   No current Epic-ordered facility-administered medications on file.   Allergies  . Codeine Itching and Nausea   Past Medical History:  . COPD (chronic obstructive pulmonary disease) (CMS-HCC)  . Glaucoma  . Shingles 09/2019  . Thyroid disease   Past Surgical History:  . fibrocystic breast surgery  . KNEE ARTHROSCOPY  . plantar fascial release right foot  . TONSILLECTOMY   Family History  . Heart disease Mother  . Breast cancer Mother  . Diabetes Mother  . Heart disease Sister  . Breast cancer Sister  . Stroke Sister  . Diabetes Sister  . Heart disease Father  . Stroke Sister  . Diabetes Sister  . Heart disease Sister   Social History   Socioeconomic History  . Marital status: Married  Spouse name: Not on file  . Number of children: Not on file  . Years of education: Not on file  . Highest education level: Not on file  Occupational History  . Occupation: Retired  Tobacco Use  . Smoking status: Former Smoker  Packs/day: 1.00  Years: 30.00  Pack years: 30.00  Types: Cigarettes  Quit date: 05/08/1989  Years since quitting: 30.6  . Smokeless tobacco: Never Used  Vaping Use  . Vaping Use: Never used  Substance and Sexual Activity  . Alcohol use: No  . Drug use: Never  . Sexual activity: Not Currently  Birth control/protection: Post-menopausal  Other Topics Concern  . Would you please tell us about the people who live in your home, your pets, or anything else important to your social life? 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 Difficulty of Paying Living Expenses:  Food Insecurity:  . Worried About Charity fundraiser in the Last Year:  . Arboriculturist in the Last Year:  Transportation Needs:  . Film/video editor (Medical):  Marland Kitchen Lack of Transportation (Non-Medical):   Review of Systems:  A comprehensive 14 point ROS was performed, reviewed, and the pertinent orthopaedic findings are documented in the HPI.  Physical Exam: Vitals:  12/27/19 1424  BP: 130/80  Weight: 85.1 kg (187 lb 9.6 oz)  Height: 161.3 cm (5' 3.5")  PainSc: 0-No pain  PainLoc: Hip   General/Constitutional: The patient appears to be well-nourished, well-developed, and in no acute distress. Neuro/Psych: Normal mood and affect, oriented to person, place and time. Eyes: Non-icteric. Pupils are equal, round, and reactive to light, and exhibit synchronous movement. ENT: Unremarkable. Lymphatic: No palpable adenopathy. Respiratory: Lungs clear to auscultation, Normal chest excursion, No wheezes and Non-labored breathing Cardiovascular: Regular rate and rhythm. No murmurs. and No edema, swelling or tenderness, except as noted in detailed exam. Integumentary: No impressive skin lesions present, except as noted in detailed exam. Musculoskeletal: Unremarkable, except as noted in detailed exam.  Lumbar exam: The patient demonstrates a mild limp and uses a cane for balance and support. The patient is able to arise from a seated position with moderate pain in the lateral aspect of her left hip. In stance, her spine is straight and her pelvis is level. There is no tenderness to palpation along the thoracic or lumbar spine, nor across the sacrum. She experiences moderate focal tenderness to palpation over the left greater trochanteric region, but there is no tenderness over the right greater trochanteric region. She has no tenderness over either SI joint or either sciatic notch  region. She can heel raise and toe raise without pain. However, she does note moderate reproduction of her left lateral hip pain when she marches in place, especially with single-leg stance on the left.  Left hip exam:  Skin inspection of the left hip again is unremarkable. No swelling, erythema, ecchymosis, abrasions, or other skin abnormalities are identified. She exhibits pain-free passive motion of her hip, but does have some discomfort with resisted hip abduction. She remains neurovascularly intact to the left lower extremity and foot, and has a negative sitting straight leg raise on the left.  Assessment: . Tear of left gluteus medius tendon   Plan: The treatment options were discussed with the patient. In addition, patient educational materials were provided regarding the diagnosis and treatment options. The patient is quite frustrated by her symptoms and functional limitations, and is ready to consider more aggressive treatment options. Therefore, I have recommended a surgical procedure, specifically a primary repair of the gluteus medius tendon tear. The procedure was discussed with the patient, as were the potential risks (including bleeding, infection, nerve and/or blood vessel injury, persistent or recurrent  pain, failure of the repair, weakness of the hip abductor muscles, need for further surgery, blood clots, strokes, heart attacks and/or arhythmias, pneumonia, etc.) and benefits. The patient states her understanding and wishes to proceed. All of the patient's questions and concerns were answered. She can call any time with further concerns. She will follow up post-surgery, routine.   H&P reviewed and patient re-examined. No changes.

## 2019-12-28 NOTE — Progress Notes (Signed)
PT Cancellation Note  Patient Details Name: Catherine Ray MRN: [id number] DOB: 1946-04-10   Cancelled Treatment:    Reason Eval/Treat Not Completed: Patient not medically ready At 1700 pt still in PACU, slow to recover from surgery and unable to transition to Phase II.  Spoke with surgeon, pt will not being able to d/c home today as initially planned.  Will eval pt tomorrow and progress as appropriate.  Kreg Shropshire, DPT 12/28/2019, 5:02 PM

## 2019-12-28 NOTE — TOC Progression Note (Signed)
Transition of Care [facility]) - Progression Note    Patient Details  Name: Catherine Ray MRN: [id number] Date of Birth: 1946-07-29  Transition of [facility]) CM/SW [place], RN Phone Number: 12/28/2019, 3:20 PM  Clinical Narrative:     Received a call From Malachy Mood at [facility], they have the referral from 74 office and they are accepting the patient for [facility] ser[facility]       Expected Discharge Plan and Services                                                 Social Determinants of Health (SDOH) Interventions    Readmission Risk Interventions No flowsheet data found.

## 2019-12-28 NOTE — Anesthesia Postprocedure Evaluation (Signed)
Anesthesia Post Note  Patient: Catherine Ray  Procedure(s) Performed: REPAIR OF LEFT GLUTEUS MEDIUS TENDON (Left Hip)  Patient location during evaluation: PACU Anesthesia Type: General Level of consciousness: awake and alert Pain management: pain level controlled Vital Signs Assessment: post-procedure vital signs reviewed and stable Respiratory status: spontaneous breathing, nonlabored ventilation and respiratory function stable Cardiovascular status: blood pressure returned to baseline and stable Anesthetic complications: no     Last Vitals:  Vitals:   12/28/19 1934 12/28/19 2056  BP: 134/67 (!) 145/77  Pulse: (!) 105 (!) 104  Resp: 15 16  Temp: 36.6 C 36.4 C  SpO2: 97% 98%    Last Pain:  Vitals:   12/28/19 2056  TempSrc: Oral  PainSc:                  Tera Mater

## 2019-12-28 NOTE — Anesthesia Preprocedure Evaluation (Signed)
Anesthesia Evaluation  Patient identified by MRN, date of birth, ID band Patient awake    Reviewed: Allergy & Precautions, NPO status , Patient's Chart, lab work & pertinent test results  History of Anesthesia Complications Negative for: history of anesthetic complications  Airway Mallampati: II  TM Distance: <3 FB Neck ROM: Full    Dental no notable dental hx.    Pulmonary neg sleep apnea, COPD, former smoker,    breath sounds clear to auscultation- rhonchi (-) wheezing      Cardiovascular Exercise Tolerance: Good (-) hypertension(-) CAD, (-) Past MI, (-) Cardiac Stents and (-) CABG  Rhythm:Regular Rate:Normal - Systolic murmurs and - Diastolic murmurs    Neuro/Psych neg Seizures negative neurological ROS  negative psych ROS   GI/Hepatic negative GI ROS, Neg liver ROS,   Endo/Other  neg diabetesHypothyroidism   Renal/GU negative Renal ROS     Musculoskeletal  (+) Arthritis ,   Abdominal (+) + obese,   Peds  Hematology negative hematology ROS (+)   Anesthesia Other Findings Past Medical History: No date: COPD (chronic obstructive pulmonary disease) ([place]) 2014: Squamous cell cancer of skin of forearm No date: Thyroid disease   Reproductive/Obstetrics                             Anesthesia Physical Anesthesia Plan  ASA: II  Anesthesia Plan: General   Post-op Pain Management:    Induction: Intravenous  PONV Risk Score and Plan: 2 and Ondansetron and Dexamethasone  Airway Management Planned: Oral ETT  Additional Equipment:   Intra-op Plan:   Post-operative Plan: Extubation in OR  Informed Consent: I have reviewed the patients History and Physical, chart, labs and discussed the procedure including the risks, benefits and alternatives for the proposed anesthesia with the patient or authorized representative who has indicated his/her understanding and acceptance.     Dental  advisory given  Plan Discussed with: CRNA and Anesthesiologist  Anesthesia Plan Comments:         Anesthesia Quick Evaluation

## 2019-12-28 NOTE — Anesthesia Procedure Notes (Signed)
Procedure Name: Intubation Date/Time: 12/28/2019 1:15 PM Performed by: Justus Memory, CRNA Pre-anesthesia Checklist: Patient identified, Patient being monitored, Timeout performed, Emergency Drugs available and Suction available Patient Re-evaluated:Patient Re-evaluated prior to induction Oxygen Delivery Method: Circle system utilized Preoxygenation: Pre-oxygenation with 100% oxygen Induction Type: IV induction Ventilation: Mask ventilation with difficulty and Oral airway inserted - appropriate to patient size Laryngoscope Size: 3 and McGraph Grade View: Grade I Tube type: Oral Tube size: 7.0 mm Number of attempts: 1 Airway Equipment and Method: Stylet and Video-laryngoscopy Placement Confirmation: ETT inserted through vocal cords under direct vision,  positive ETCO2 and breath sounds checked- equal and bilateral Secured at: 21 cm Tube secured with: Tape Dental Injury: Teeth and Oropharynx as per pre-operative assessment  Difficulty Due To: Difficulty was anticipated and Difficult Airway- due to anterior larynx Future Recommendations: Recommend- induction with short-acting agent, and alternative techniques readily available

## 2019-12-29 ENCOUNTER — Encounter: Payer: Self-pay | Admitting: Surgery

## 2019-12-29 DIAGNOSIS — S76012A Strain of muscle, fascia and tendon of left hip, initial encounter: Secondary | ICD-10-CM | POA: Diagnosis not present

## 2019-12-29 NOTE — Evaluation (Signed)
Physical Therapy Evaluation Patient Details Name: Catherine Ray MRN: [id number] DOB: 01-24-1946 Today's Date: 12/29/2019   History of Present Illness  Pt is a 74 yo female diagnosed with a partial tear of the left gluteus medius tendon and is s/p repair.  PMH includes: COPD, hypothyroidism, skin cancer, and arthritis.    Clinical Impression  Pt pleasant and motivated to participate during the session.  Pt required min A for bed mobility to assist with LLE but was able to stand without physical assistance.  Pt required cuing to ensure proper sequencing with transfers to avoid LLE WB.  Pt able to perform multiple short walks with a RW and mostly hop-to gait with fair stability but did have some trouble clearing the floor when advancing the RLE.  Pt safe with short distance amb with CGA but based on difficulty clearing the floor when advancing the RLE would not be safe attempting to clear steps with stair training.  Pt adamant that she will not consider SNF with lengthy discussion and trouble shooting regarding how to get into the home.  Advised pt that EMS transport would be the safest for her and her caregivers at this time with pt agreeing, SW notified.  Pt will benefit from HHPT services upon discharge to safely address deficits listed in patient problem list for decreased caregiver assistance and eventual return to PLOF.      Follow Up Recommendations Home health PT;Other (comment);Supervision for mobility/OOB(With EMS transport for safe home entry/exit)    Equipment Recommendations  Rolling walker with 5" wheels;3in1 (PT)    Recommendations for Other Services       Precautions / Restrictions Precautions Precautions: Fall Required Braces or Orthoses: Other Brace Other Brace: L hip abd brace Restrictions Weight Bearing Restrictions: Yes LLE Weight Bearing: Touchdown weight bearing Other Position/Activity Restrictions: Hip abd brace may be unlocked in sitting but is to be locked when pt  is ambulatory; hinges restricted to 20-70 deg      Mobility  Bed Mobility Overal bed mobility: Needs Assistance Bed Mobility: Supine to Sit;Sit to Supine     Supine to sit: Min assist Sit to supine: Min assist   General bed mobility comments: Min A to guide LLE  Transfers Overall transfer level: Needs assistance Equipment used: Rolling walker (2 wheeled) Transfers: Sit to/from Stand Sit to Stand: Min guard         General transfer comment: Mod verbal cues for sequencing  Ambulation/Gait Ambulation/Gait assistance: Min guard Gait Distance (Feet): 10 Feet x 3 Assistive device: Rolling walker (2 wheeled) Gait Pattern/deviations: Step-to pattern Gait velocity: decreased   General Gait Details: Mod verbal and visual cues for proper sequencing for WB compliance with pt steady but with some difficulty with clearance when advancing the RLE  Stairs            Wheelchair Mobility    Modified Rankin (Stroke Patients Only)       Balance Overall balance assessment: Needs assistance   Sitting balance-Leahy Scale: Normal     Standing balance support: Bilateral upper extremity supported;During functional activity Standing balance-Leahy Scale: Fair Standing balance comment: Mod lean on the RW for support in standing                             Pertinent Vitals/Pain Pain Assessment: 0-10 Pain Score: 3  Pain Location: L hip Pain Descriptors / Indicators: Sore Pain Intervention(s): Premedicated before session;Monitored during session  Home Living Family/patient expects to be discharged to:: Private residence Living Arrangements: Spouse/significant other;Children Available Help at Discharge: Family;Available 24 hours/day Type of Home: House Home Access: Stairs to enter Entrance Stairs-Rails: Right;Can reach Software engineer of Steps: 5 with Bil narrow rails or 3 steps with one rail Home Layout: One level Home Equipment: Cane -  single point;Wheelchair - manual      Prior Function Level of Independence: Independent with assistive device(s)         Comments: Pt mostly Ind with amb without an AD but started using a SPC recently secondary to increasing L hip pain, no fall history, Ind with ADLs     Hand Dominance        Extremity/Trunk Assessment   Upper Extremity Assessment Upper Extremity Assessment: Overall WFL for tasks assessed    Lower Extremity Assessment Lower Extremity Assessment: Generalized weakness;LLE deficits/detail LLE: Unable to fully assess due to immobilization;Unable to fully assess due to pain       Communication   Communication: No difficulties  Cognition Arousal/Alertness: Awake/alert Behavior During Therapy: WFL for tasks assessed/performed Overall Cognitive Status: Within Functional Limits for tasks assessed                                        General Comments      Exercises Total Joint Exercises Ankle Circles/Pumps: Strengthening;Both;10 reps Quad Sets: Strengthening;Both;10 reps Long Arc Quad: AROM;Both;10 reps   Assessment/Plan    PT Assessment Patient needs continued PT services  PT Problem List Decreased strength;Decreased range of motion;Decreased activity tolerance;Decreased balance;Decreased mobility;Pain;Decreased knowledge of precautions;Decreased knowledge of use of DME       PT Treatment Interventions DME instruction;Gait training;Stair training;Functional mobility training;Therapeutic activities;Therapeutic exercise;Balance training;Patient/family education    PT Goals (Current goals can be found in the Care Plan section)  Acute Rehab PT Goals Patient Stated Goal: To get back home and walk better PT Goal Formulation: With patient Time For Goal Achievement: 01/11/20 Potential to Achieve Goals: Good    Frequency BID   Barriers to discharge        Co-evaluation               AM-PAC PT "6 Clicks" Mobility  Outcome  Measure Help needed turning from your back to your side while in a flat bed without using bedrails?: A Little Help needed moving from lying on your back to sitting on the side of a flat bed without using bedrails?: A Little Help needed moving to and from a bed to a chair (including a wheelchair)?: A Little Help needed standing up from a chair using your arms (e.g., wheelchair or bedside chair)?: A Little Help needed to walk in hospital room?: A Little Help needed climbing 3-5 steps with a railing? : Total 6 Click Score: 16    End of Session Equipment Utilized During Treatment: Gait belt;Other (comment)(L hip abd brace) Activity Tolerance: Patient tolerated treatment well Patient left: in bed;with call bell/phone within reach;with bed alarm set;with family/visitor present;with SCD's reapplied Nurse Communication: Mobility status PT Visit Diagnosis: Other abnormalities of gait and mobility (R26.89);Muscle weakness (generalized) (M62.81);Pain Pain - Right/Left: Left Pain - part of body: Hip    Time: 0900-1003 PT Time Calculation (min) (ACUTE ONLY): 63 min   Charges:   PT Evaluation $PT Eval Moderate Complexity: 1 Mod PT Treatments $Gait Training: 23-37 mins  Linus Salmons PT, DPT 12/29/19, 11:33 AM

## 2019-12-29 NOTE — Discharge Summary (Signed)
Physician Discharge Summary  Patient ID: Catherine Ray MRN: [id number] DOB/AGE: Dec 14, 1945 74 y.o.  Admit date: 12/28/2019 Discharge date: 12/29/2019  Admission Diagnoses:  Tear of left gluteus medius tendon [S76.012A]  Discharge Diagnoses: Patient Active Problem List   Diagnosis Date Noted  . Tear of left gluteus medius tendon 12/28/2019  . Fracture, foot, right, closed, initial encounter 11/26/2017  . Hypothyroidism 04/28/2017  . Glaucoma 04/28/2017  . Vitamin D deficiency 04/28/2017  . COPD (chronic obstructive pulmonary disease) ([place]) 04/28/2017  . Chronic pain in left foot 04/28/2017  . Prediabetes 04/28/2017  . Osteoarthritis 04/28/2017  . Obesity (BMI 30.0-34.9) 04/28/2017    Past Medical History:  Diagnosis Date  . COPD (chronic obstructive pulmonary disease) ([place])   . Squamous cell cancer of skin of forearm 2014  . Thyroid disease      Transfusion: None.   Consultants (if any):   Discharged Condition: Improved  Hospital Course: HELA BRUEGGEMAN is an 75 y.o. female who was admitted 12/28/2019 with a diagnosis of a partial tear of the left gluteus medius tendon and went to the operating room on 12/28/2019 and underwent the above named procedures.    Surgeries: Procedure(s): REPAIR OF LEFT GLUTEUS MEDIUS TENDON on 12/28/2019 Patient tolerated the surgery well. Taken to PACU where she did have some difficulty waking up following anesthesia so she was admitted overnight for observation.  She performed well with PT on POD1 and was stable for discharge.  Started on Lovenox 40mg  q 24 hrs. Foot pumps applied bilaterally at 80 mm. Heels elevated on bed with rolled towels. No evidence of DVT. Negative Homan. Physical therapy started on day #1 for gait training and transfer. OT started day #1 for ADL and assisted devices.  Patient's IV was d/c on POD1.  Implants: [employer]hew TwinFix titanium 5 mm suture anchors x3, [employer]hew 5.5 mm Healicoil knotless Regenesorb  anchors x2.  She was given perioperative antibiotics:  Anti-infectives (From admission, onward)   Start     Dose/Rate Route Frequency Ordered Stop   12/28/19 2000  ceFAZolin (ANCEF) IVPB 2g/100 mL premix     2 g 200 mL/hr over 30 Minutes Intravenous Every 6 hours 12/28/19 1817 12/29/19 0931   12/28/19 1202  ceFAZolin (ANCEF) 2-4 GM/100ML-% IVPB    Note to Pharmacy: Myles Lipps   : cabinet override      12/28/19 1202 12/28/19 1336   12/28/19 1200  ceFAZolin (ANCEF) IVPB 2g/100 mL premix     2 g 200 mL/hr over 30 Minutes Intravenous On call to O.R. 12/28/19 1150 12/28/19 1330    .  She was given sequential compression devices, early ambulation, and Lovenox for DVT prophylaxis.  She benefited maximally from the hospital stay and there were no complications.    Recent vital signs:  Vitals:   12/29/19 0724 12/29/19 1159  BP: 127/74 (!) 123/56  Pulse: 62 72  Resp: 14 18  Temp: 97.6 F (36.4 C) 98.2 F (36.8 C)  SpO2: 100% 98%    Recent laboratory studies:  Lab Results  Component Value Date   HGB 12.5 12/20/2019   HGB 13.1 04/28/2017   Lab Results  Component Value Date   WBC 5.7 12/20/2019   PLT 301 12/20/2019   No results found for: INR Lab Results  Component Value Date   NA 140 12/20/2019   K 4.1 12/20/2019   CL 104 12/20/2019   CO2 27 12/20/2019   BUN 15 12/20/2019   CREATININE 0.89 12/20/2019  GLUCOSE 110 (H) 12/20/2019    Discharge Medications:   Allergies as of 12/29/2019      Reactions   Codeine Nausea Only      Medication List    TAKE these medications   acetaminophen 500 MG tablet Commonly known as: TYLENOL Take 1,000 mg by mouth 2 (two) times daily as needed for moderate pain or headache.   albuterol 108 (90 Base) MCG/ACT inhaler Commonly known as: VENTOLIN HFA Inhale 2 puffs into the lungs every 6 (six) hours as needed for wheezing or shortness of breath.   alendronate 70 MG tablet Commonly known as: FOSAMAX Take 70 mg by mouth every  Sunday. Take with a full glass of water on an empty stomach.   CALCIUM 600 + D PO Take 2 tablets by mouth 4 (four) times a week.   chlorpheniramine 4 MG tablet Commonly known as: CHLOR-TRIMETON Take 4 mg by mouth 2 (two) times daily as needed for allergies or rhinitis.   gabapentin 300 MG capsule Commonly known as: NEURONTIN Take 300 mg by mouth at bedtime.   latanoprost 0.005 % ophthalmic solution Commonly known as: XALATAN Place 1 drop into both eyes at bedtime.   LEG CRAMPS PO Take 2 tablets by mouth daily as needed (cramps).   levothyroxine 112 MCG tablet Commonly known as: SYNTHROID Take 112 mcg by mouth 2 (two) times a week. Sat - Sun   levothyroxine 125 MCG tablet Commonly known as: SYNTHROID Take 125 mcg by mouth daily. Mon - Fri   ondansetron 4 MG disintegrating tablet Commonly known as: Zofran ODT Take 1 tablet (4 mg total) by mouth every 8 (eight) hours as needed for nausea or vomiting.   oxyCODONE 5 MG immediate release tablet Commonly known as: Roxicodone Take 1-2 tablets (5-10 mg total) by mouth every 4 (four) hours as needed for moderate pain or severe pain.   tiotropium 18 MCG inhalation capsule Commonly known as: SPIRIVA Place 18 mcg into inhaler and inhale daily.            Durable Medical Equipment  (From admission, onward)         Start     Ordered   12/29/19 0902  For home use only DME Bedside commode  Once    Question:  Patient needs a bedside commode to treat with the following condition  Answer:  Tear of left gluteus medius tendon   12/29/19 0901   12/29/19 0901  For home use only DME Walker rolling  Once    Question Answer Comment  Walker: With 5 Inch Wheels   Patient needs a walker to treat with the following condition Tear of left gluteus medius tendon      05 /26/21 0901         Diagnostic Studies: No results found.  Disposition: Discharge disposition: 01-Home or Self Care       Follow-up Information    Poggi, Marshall Cork, MD Follow up.   Specialty: Orthopedic Surgery Why: January 10, 2020 @ 3:15 pm with Morley Kos Contact information: [address] [place] [address] [phone]          Signed: Judson Roch PA-C 12/29/2019, 1:15 PM

## 2019-12-29 NOTE — TOC Transition Note (Addendum)
Transition of Care [facility]) - CM/SW Discharge Note   Patient Details  Name: Catherine Ray MRN: [id number] Date of Birth: Apr 22, 1946  [facility] CM/SW Contact:  Su Hilt, RN Phone Number: 12/29/2019, 8:55 AM   Clinical Narrative:    Spoke with the patient and her daughter at the bedside, she lives at home with her husband that she is the caregiver for Her daughter will come and stay with her 24/7 until she is able to do things on her own She needs a RW and a 3 in 1, I notified Zack with [employer], she is set up with [facility] for [facility] and wants to add an Aide.  No additional needs at this time Will DC via EMS transport to get into the home, The bedside nurse is going to make me aware when to call EMS   Final next level of care: [place] Barriers to Discharge: Barriers Resolved   Patient Goals and CMS Choice Patient states their goals for this hospitalization and ongoing recovery are:: go home with husband and daughter      Discharge Placement                       Discharge Plan and Services   Discharge Planning Services: CM Consult            DME Arranged: 3-N-1, Walker rolling DME Agency: [employer] Date DME Agency Contacted: 12/29/19 Time DME Agency Contacted: [phone] Representative spoke with at DME Agency: Zack HH Arranged: PT, Nurse's Aide [place] Agency: [place] Date [place]: 12/29/19 Time [place]: [phone] Representative spoke with at [place]: [place] ([place]) Interventions     Readmission Risk Interventions No flowsheet data found.

## 2019-12-29 NOTE — Plan of Care (Signed)
  Problem: Health Behavior/Discharge Planning: Goal: Ability to manage health-related needs will improve Outcome: Progressing   Problem: Clinical Measurements: Goal: Ability to maintain clinical measurements within normal limits will improve Outcome: Progressing Goal: Will remain free from infection Outcome: Progressing Goal: Diagnostic test results will improve Outcome: Progressing Goal: Respiratory complications will improve Outcome: Progressing   Problem: Activity: Goal: Risk for activity intolerance will decrease Outcome: Progressing   Problem: Nutrition: Goal: Adequate nutrition will be maintained Outcome: Progressing   Problem: Coping: Goal: Level of anxiety will decrease Outcome: Progressing   Problem: Elimination: Goal: Will not experience complications related to bowel motility Outcome: Progressing   Problem: Pain Managment: Goal: General experience of comfort will improve Outcome: Progressing   Problem: Safety: Goal: Ability to remain free from injury will improve Outcome: Progressing   Problem: Skin Integrity: Goal: Risk for impaired skin integrity will decrease Outcome: Progressing   

## 2019-12-29 NOTE — Progress Notes (Signed)
D/c paperwork has been reviewed with pt and daughter at bedside and they both express understanding.  IV removed from pts R hand without issue.  NAD noted at time of d/c.  BSC and RW given to daughter to take home.  TEDS on, dressing clean dry and intact, extra dressings were given in the even pt needs them.  Brace currently on and pt is comfortable.  RN Case manager has phoned EMS pt is dressed and belongings have been packed up.  Will monitor.

## 2019-12-29 NOTE — TOC Transition Note (Signed)
Transition of Care [facility]) - CM/SW Discharge Note   Patient Details  Name: Catherine Ray MRN: [id number] Date of Birth: 1946/04/01  [facility] CM/SW Contact:  Su Hilt, RN Phone Number: 12/29/2019, 1:42 PM   Clinical Narrative:     Bedside nurse is ready to have EMS called RNCM called and requested transport to the home, The patient is next in line  Final next level of care: Home w [place] Barriers to Discharge: Barriers Resolved   Patient Goals and CMS Choice Patient states their goals for this hospitalization and ongoing recovery are:: go home with husband and daughter      Discharge Placement                       Discharge Plan and Services   Discharge Planning Services: CM Consult            DME Arranged: 3-N-1, Walker rolling DME Agency: [employer] Date DME Agency Contacted: 12/29/19 Time DME Agency Contacted: [phone] Representative spoke with at DME Agency: Edwyna Ready HH Arranged: PT, Nurse's Aide [place] Agency: [place] Date [place]: 12/29/19 Time [place]: [phone] Representative spoke with at [place]: [place] ([place]) Interventions     Readmission Risk Interventions No flowsheet data found.

## 2020-06-05 ENCOUNTER — Other Ambulatory Visit: Payer: Self-pay | Admitting: Family Medicine

## 2020-06-05 DIAGNOSIS — Z1231 Encounter for screening mammogram for malignant neoplasm of breast: Secondary | ICD-10-CM

## 2020-06-27 ENCOUNTER — Other Ambulatory Visit: Payer: Self-pay

## 2020-06-27 ENCOUNTER — Ambulatory Visit
Admission: RE | Admit: 2020-06-27 | Discharge: 2020-06-27 | Disposition: A | Discharge status: Home / Self Care | Payer: Medicare Other | Source: Ambulatory Visit | Attending: Family Medicine | Admitting: Family Medicine

## 2020-06-27 DIAGNOSIS — Z1231 Encounter for screening mammogram for malignant neoplasm of breast: Secondary | ICD-10-CM | POA: Diagnosis not present

## 2020-10-16 ENCOUNTER — Other Ambulatory Visit: Payer: Self-pay | Admitting: Surgery

## 2020-10-16 DIAGNOSIS — S76012D Strain of muscle, fascia and tendon of left hip, subsequent encounter: Secondary | ICD-10-CM

## 2020-10-30 ENCOUNTER — Ambulatory Visit
Admission: RE | Admit: 2020-10-30 | Discharge: 2020-10-30 | Disposition: A | Discharge status: Home / Self Care | Payer: Medicare Other | Source: Ambulatory Visit | Attending: Surgery | Admitting: Surgery

## 2020-10-30 ENCOUNTER — Other Ambulatory Visit: Payer: Self-pay

## 2020-10-30 DIAGNOSIS — S76012D Strain of muscle, fascia and tendon of left hip, subsequent encounter: Secondary | ICD-10-CM | POA: Insufficient documentation

## 2021-01-21 ENCOUNTER — Ambulatory Visit (INDEPENDENT_AMBULATORY_CARE_PROVIDER_SITE_OTHER): Payer: Medicare Other

## 2021-01-21 ENCOUNTER — Ambulatory Visit
Admission: EM | Admit: 2021-01-21 | Discharge: 2021-01-21 | Disposition: A | Discharge status: Home / Self Care | Payer: Medicare Other | Attending: Emergency Medicine | Admitting: Emergency Medicine

## 2021-01-21 ENCOUNTER — Other Ambulatory Visit: Payer: Self-pay

## 2021-01-21 DIAGNOSIS — R519 Headache, unspecified: Secondary | ICD-10-CM | POA: Insufficient documentation

## 2021-01-21 DIAGNOSIS — J069 Acute upper respiratory infection, unspecified: Secondary | ICD-10-CM | POA: Diagnosis not present

## 2021-01-21 DIAGNOSIS — R0981 Nasal congestion: Secondary | ICD-10-CM | POA: Insufficient documentation

## 2021-01-21 DIAGNOSIS — Z20822 Contact with and (suspected) exposure to covid-19: Secondary | ICD-10-CM | POA: Diagnosis not present

## 2021-01-21 DIAGNOSIS — R059 Cough, unspecified: Secondary | ICD-10-CM | POA: Diagnosis not present

## 2021-01-21 DIAGNOSIS — R062 Wheezing: Secondary | ICD-10-CM

## 2021-01-21 DIAGNOSIS — J40 Bronchitis, not specified as acute or chronic: Secondary | ICD-10-CM | POA: Diagnosis not present

## 2021-01-21 MED ORDER — IPRATROPIUM BROMIDE 0.06 % NA SOLN
2.0000 | Freq: Four times a day (QID) | NASAL | 12 refills | Status: DC
Start: 2021-01-21 — End: 2024-02-17

## 2021-01-21 MED ORDER — BENZONATATE 100 MG PO CAPS
200.0000 mg | ORAL_CAPSULE | Freq: Three times a day (TID) | ORAL | 0 refills | Status: DC
Start: 2021-01-21 — End: 2024-02-17

## 2021-01-21 MED ORDER — PROMETHAZINE-DM 6.25-15 MG/5ML PO SYRP: 5.0000 mL | ORAL_SOLUTION | Freq: Four times a day (QID) | ORAL | 0 refills | Status: DC | PRN

## 2021-01-21 MED ORDER — DOXYCYCLINE HYCLATE 100 MG PO CAPS
100.0000 mg | ORAL_CAPSULE | Freq: Two times a day (BID) | ORAL | 0 refills | Status: DC
Start: 2021-01-21 — End: 2024-02-17

## 2021-01-21 NOTE — ED Provider Notes (Signed)
Pending the COVID swab.  If her swab is positive we will treat with [facility]    CSN: [id number] Arrival date & time: 01/21/21  1113      History   Chief Complaint Chief Complaint  Patient presents with   Cough   Nasal Congestion   Headache    HPI Catherine Ray is a 75 y.o. female.   HPI  75 year old female here for evaluation of respiratory complaints.  Patient reports that 2 days ago she developed a headache, nasal congestion with clear nasal discharge, sore throat, productive cough for clear sputum with some associated wheezing, dizziness, and fever with a T-max of 101.4.  She also had 1 episode of nausea and vomiting yesterday but none since.  She denies any ear pain, diarrhea, or body aches.  She has had her COVID-vaccine plus her booster shot as well as her flu shot.  Her husband is on home hospice and she has caregivers coming in and out of her home all the time.  Past Medical History:  Diagnosis Date   COPD (chronic obstructive pulmonary disease) ([place])    Squamous cell cancer of skin of forearm 2014   Thyroid disease     Patient Active Problem List   Diagnosis Date Noted   Tear of left gluteus medius tendon 12/28/2019   Fracture, foot, right, closed, initial encounter 11/26/2017   Hypothyroidism 04/28/2017   Glaucoma 04/28/2017   Vitamin D deficiency 04/28/2017   COPD (chronic obstructive pulmonary disease) ([place]) 04/28/2017   Chronic pain in left foot 04/28/2017   Prediabetes 04/28/2017   Osteoarthritis 04/28/2017   Obesity (BMI 30.0-34.9) 04/28/2017    Past Surgical History:  Procedure Laterality Date   BREAST CYST EXCISION Bilateral    neg   FOOT SURGERY     KNEE SURGERY     QUADRICEPS TENDON REPAIR Left 12/28/2019   Procedure: REPAIR OF LEFT GLUTEUS MEDIUS TENDON;  Surgeon: Corky Mull, MD;  Location: [facility] ORS;  Service: Orthopedics;  Laterality: Left;   SQUAMOUS CELL CARCINOMA EXCISION     Arm   TONSILLECTOMY      OB History    No obstetric history on file.      Home Medications    Prior to Admission medications   Medication Sig Start Date End Date Taking? Authorizing Provider  acetaminophen (TYLENOL) 500 MG tablet Take 1,000 mg by mouth 2 (two) times daily as needed for moderate pain or headache.  04/28/17  Yes [provider]  albuterol (PROVENTIL HFA;VENTOLIN HFA) 108 (90 Base) MCG/ACT inhaler Inhale 2 puffs into the lungs every 6 (six) hours as needed for wheezing or shortness of breath.    Yes [provider]  alendronate (FOSAMAX) 70 MG tablet Take 70 mg by mouth every Sunday. Take with a full glass of water on an empty stomach.   Yes [provider]  benzonatate (TESSALON) 100 MG capsule Take 2 capsules (200 mg total) by mouth every 8 (eight) hours. 01/21/21  Yes Margarette Canada, NP  Calcium Carb-Cholecalciferol (CALCIUM 600 + D PO) Take 2 tablets by mouth 4 (four) times a week.    Yes [provider]  doxycycline (VIBRAMYCIN) 100 MG capsule Take 1 capsule (100 mg total) by mouth 2 (two) times daily. 01/21/21  Yes Margarette Canada, NP  ipratropium (ATROVENT) 0.06 % nasal spray Place 2 sprays into both nostrils 4 (four) times daily. 01/21/21  Yes Margarette Canada, NP  latanoprost (XALATAN) 0.005 % ophthalmic solution Place 1 drop  into both eyes at bedtime.    Yes [provider]  levothyroxine (SYNTHROID) 88 MCG tablet Take 88 mcg by mouth daily. 01/04/21  Yes [provider]  promethazine-dextromethorphan (PROMETHAZINE-DM) 6.25-15 MG/5ML syrup Take 5 mLs by mouth 4 (four) times daily as needed. 01/21/21  Yes Margarette Canada, NP  tiotropium (SPIRIVA) 18 MCG inhalation capsule Place 18 mcg into inhaler and inhale daily.   Yes [provider]  Homeopathic Products (LEG CRAMPS PO) Take 2 tablets by mouth daily as needed (cramps).     [provider]    Family History Family History  Problem Relation Age of Onset   Cancer Mother    Hypertension Mother     Diabetes Mother    Breast cancer Mother 54   Heart failure Father    Hypertension Sister    Diabetes Sister    Cancer Sister    Stroke Sister    Breast cancer Sister 31    Social History Social History   Tobacco Use   Smoking status: Former    Pack years: 0.00    Types: Cigarettes   Smokeless tobacco: Never  Vaping Use   Vaping Use: Never used  Substance Use Topics   Alcohol use: No   Drug use: No     Allergies   Codeine   Review of Systems Review of Systems  Constitutional:  Positive for fever. Negative for activity change and appetite change.  HENT:  Positive for congestion, postnasal drip, rhinorrhea and sore throat. Negative for ear pain.   Respiratory:  Positive for cough and wheezing. Negative for shortness of breath.   Gastrointestinal:  Positive for nausea and vomiting. Negative for diarrhea.  Musculoskeletal:  Negative for arthralgias and myalgias.  Skin:  Negative for rash.  Neurological:  Positive for dizziness and headaches.  Hematological: Negative.   Psychiatric/Behavioral: Negative.      Physical Exam Triage Vital Signs ED Triage Vitals  Enc Vitals Group     BP 01/21/21 1238 111/71     Pulse Rate 01/21/21 1238 87     Resp 01/21/21 1238 18     Temp 01/21/21 1238 98.9 F (37.2 C)     Temp Source 01/21/21 1238 Oral     SpO2 01/21/21 1238 93 %     Weight 01/21/21 1232 179 lb (81.2 kg)     Height 01/21/21 1232 5' 3.5" (1.613 m)     Head Circumference --      Peak Flow --      Pain Score 01/21/21 1232 0     Pain Loc --      Pain Edu? --      Excl. in [place]? --    No data found.  Updated Vital Signs BP 111/71 (BP Location: Left Arm)   Pulse 87   Temp 98.9 F (37.2 C) (Oral)   Resp 18   Ht 5' 3.5" (1.613 m)   Wt 179 lb (81.2 kg)   SpO2 93%   BMI 31.21 kg/m   Visual Acuity Right Eye Distance:   Left Eye Distance:   Bilateral Distance:    Right Eye Near:   Left Eye Near:    Bilateral Near:     Physical Exam Vitals and nursing  note reviewed.  Constitutional:      General: She is not in acute distress.    Appearance: Normal appearance. She is not ill-appearing.  HENT:     Head: Normocephalic and atraumatic.     Right Ear: Tympanic  membrane, ear canal and external ear normal. There is no impacted cerumen.     Left Ear: Tympanic membrane, ear canal and external ear normal. There is no impacted cerumen.     Nose: Congestion and rhinorrhea present.     Mouth/Throat:     Mouth: Mucous membranes are moist.     Pharynx: Oropharynx is clear. No posterior oropharyngeal erythema.  Cardiovascular:     Rate and Rhythm: Normal rate and regular rhythm.     Pulses: Normal pulses.     Heart sounds: Normal heart sounds. No murmur heard.   No gallop.  Pulmonary:     Effort: Pulmonary effort is normal.     Breath sounds: Wheezing and rhonchi present. No rales.  Musculoskeletal:     Cervical back: Normal range of motion and neck supple.  Lymphadenopathy:     Cervical: No cervical adenopathy.  Skin:    General: Skin is warm and dry.     Capillary Refill: Capillary refill takes less than 2 seconds.  Neurological:     General: No focal deficit present.     Mental Status: She is alert and oriented to person, place, and time.  Psychiatric:        Mood and Affect: Mood normal.        Behavior: Behavior normal.        Thought Content: Thought content normal.        Judgment: Judgment normal.     UC Treatments / Results  Labs (all labs ordered are listed, but only abnormal results are displayed) Labs Reviewed  SARS CORONAVIRUS 2 (TAT 6-24 HRS)    EKG   Radiology DG Chest 2 View  Result Date: 01/21/2021 CLINICAL DATA:  Cough and fever.  Wheezing. EXAM: CHEST - 2 VIEW COMPARISON:  None. FINDINGS: The heart size and mediastinal contours are within normal limits. Both lungs are clear. The visualized skeletal structures are unremarkable. IMPRESSION: No active cardiopulmonary disease. Electronically Signed   By: Franchot Gallo M.D.   On: 01/21/2021 13:21    Procedures Procedures (including critical care time)  Medications Ordered in UC Medications - No data to display  Initial Impression / Assessment and Plan / UC Course  I have reviewed the triage vital signs and the nursing notes.  Pertinent labs & imaging results that were available during my care of the patient were reviewed by me and considered in my medical decision making (see chart for details).  Patient is a very pleasant 75 year old female who who is here for evaluation of respiratory complaints of being on for last 2 days as outlined in HPI above.  Patient's physical exam reveals pearly gray tympanic membranes bilaterally with a normal light reflex and clear external auditory canals.  Nasal mucosa is erythematous and edematous with scant clear nasal discharge.  Oropharyngeal exam is benign.  No cervical lymphadenopathy appreciated exam.  Cardiopulmonary exam reveals wheezes and rhonchi in bilateral upper lobes.  Patient is taken to home COVID test that was negative.  Will send COVID PCR from here and obtain chest x-ray.  Radiology interpretation of chest x-ray is that there is no active cardiopulmonary disease.  Will discharge patient home with a diagnosis of bronchitis molnupiravir has not had chemistry and since November 2021 and we do not have access to lab for the weekend.  Will discharge patient home on doxycycline, Tessalon Perles, and Promethazine DM cough syrup.  We will also give Atrovent nasal spray to help with nasal congestion.  Final  Clinical Impressions(s) / UC Diagnoses   Final diagnoses:  Bronchitis  Upper respiratory tract infection, unspecified type     Discharge Instructions      Isolate at home pending the results of your COVID test.  If you test positive then you will have to quarantine for 5 days from the start of your symptoms.  After 5 days you can break quarantine if your symptoms have improved and you have not had a  fever for 24 hours without taking Tylenol or ibuprofen.  Use over-the-counter Tylenol and ibuprofen as needed for body aches and fever.  If you develop any increased shortness of breath-especially at rest, you are unable to speak in full sentences, or is a late sign your lips are turning blue you need to go the ER for evaluation.   Take the doxycycline twice daily with food for 10 days.  Use the Atrovent nasal spray, 2 squirts in each nostril every 6 hours, as needed for runny nose and postnasal drip.  Use the Tessalon Perles every 8 hours during the day.  Take them with a small sip of water.  They may give you some numbness to the base of your tongue or a metallic taste in your mouth, this is normal.  Use the Promethazine DM cough syrup at bedtime for cough and congestion.  It will make you drowsy so do not take it during the day.  Return for reevaluation or see your primary care provider for any new or worsening symptoms.      ED Prescriptions     Medication Sig Dispense Auth. Provider   doxycycline (VIBRAMYCIN) 100 MG capsule Take 1 capsule (100 mg total) by mouth 2 (two) times daily. 20 capsule Margarette Canada, NP   benzonatate (TESSALON) 100 MG capsule Take 2 capsules (200 mg total) by mouth every 8 (eight) hours. 21 capsule Margarette Canada, NP   ipratropium (ATROVENT) 0.06 % nasal spray Place 2 sprays into both nostrils 4 (four) times daily. 15 mL Margarette Canada, NP   promethazine-dextromethorphan (PROMETHAZINE-DM) 6.25-15 MG/5ML syrup Take 5 mLs by mouth 4 (four) times daily as needed. 118 mL Margarette Canada, NP      PDMP not reviewed this encounter.   Margarette Canada, NP 01/21/21 1332

## 2021-01-21 NOTE — ED Triage Notes (Signed)
Pt c/o cough, nasal congestion, headache, sore throat and dizziness since Friday. Pt also had fever 101.4 last night. Pt did take an at-home COVID test today and it was negative. Pt denies n/v/d or other symptoms.

## 2021-01-21 NOTE — Discharge Instructions (Addendum)
Isolate at home pending the results of your COVID test.  If you test positive then you will have to quarantine for 5 days from the start of your symptoms.  After 5 days you can break quarantine if your symptoms have improved and you have not had a fever for 24 hours without taking Tylenol or ibuprofen.  Use over-the-counter Tylenol and ibuprofen as needed for body aches and fever.  If you develop any increased shortness of breath-especially at rest, you are unable to speak in full sentences, or is a late sign your lips are turning blue you need to go the ER for evaluation.   Take the doxycycline twice daily with food for 10 days.  Use the Atrovent nasal spray, 2 squirts in each nostril every 6 hours, as needed for runny nose and postnasal drip.  Use the Tessalon Perles every 8 hours during the day.  Take them with a small sip of water.  They may give you some numbness to the base of your tongue or a metallic taste in your mouth, this is normal.  Use the Promethazine DM cough syrup at bedtime for cough and congestion.  It will make you drowsy so do not take it during the day.  Return for reevaluation or see your primary care provider for any new or worsening symptoms.

## 2021-01-22 LAB — SARS CORONAVIRUS 2 (TAT 6-24 HRS): SARS Coronavirus 2: NEGATIVE

## 2021-06-11 ENCOUNTER — Other Ambulatory Visit: Payer: Self-pay | Admitting: Family Medicine

## 2021-06-11 DIAGNOSIS — Z1231 Encounter for screening mammogram for malignant neoplasm of breast: Secondary | ICD-10-CM

## 2021-07-05 ENCOUNTER — Other Ambulatory Visit: Payer: Self-pay

## 2021-07-05 ENCOUNTER — Ambulatory Visit
Admission: RE | Admit: 2021-07-05 | Discharge: 2021-07-05 | Disposition: A | Discharge status: Home / Self Care | Payer: Medicare Other | Source: Ambulatory Visit | Attending: Family Medicine | Admitting: Family Medicine

## 2021-07-05 DIAGNOSIS — Z1231 Encounter for screening mammogram for malignant neoplasm of breast: Secondary | ICD-10-CM | POA: Insufficient documentation

## 2022-06-29 ENCOUNTER — Ambulatory Visit
Admission: EM | Admit: 2022-06-29 | Discharge: 2022-06-29 | Disposition: A | Discharge status: Home / Self Care | Payer: Medicare Other

## 2022-06-29 DIAGNOSIS — S61211D Laceration without foreign body of left index finger without damage to nail, subsequent encounter: Secondary | ICD-10-CM | POA: Diagnosis not present

## 2022-06-29 NOTE — ED Triage Notes (Signed)
Pt c/o injury to left 2nd finger  Pt was pulling down christmas ornaments and a metal chair fell onto her hand.   Pt states that the cut extends across the knuckle and down the side of the finger.   Pt states that she was wearing gloves when the chair fell on her hand and the gloves did not rip. Pt only noticed the blood because the color changed along the finger.

## 2022-06-29 NOTE — Discharge Instructions (Addendum)
5 Sutures have been placed, please return in 10 to 14 days for removal  Please keep area dry for the next 24 hours, may get wet during normal hygiene, avoid dishwashing, baths  You may cleanse area daily during normal hygiene using diluted soapy water, pat dry and then may leave open to air, may cover with a Band-Aid if any concerns for site getting dirty  Please return for reevaluation for increased swelling, increased pain, limited movement of finger, numbness, tingling or drainage

## 2022-06-29 NOTE — ED Provider Notes (Signed)
[facility]    CSN: [id number] Arrival date & time: 06/29/22  1316      History   Chief Complaint Chief Complaint  Patient presents with   Finger Injury          HPI Catherine Ray is a 76 y.o. female.   Patient presents with laceration to the left index finger beginning approximately 3 hours ago.  Was reaching for a box with Christmas ornaments a metal chair beside it fell hitting hand, was wearing latex gloves and endorses that the gloves did not break but she did see blood on removal.  Has cleansed at home and applied a homemade splint, some of the bleeding has subsided.  Unsure of last tetanus.  Has full range of motion of finger.  Denies numbness or tingling.  Past Medical History:  Diagnosis Date   COPD (chronic obstructive pulmonary disease) ([place])    Squamous cell cancer of skin of forearm 2014   Thyroid disease     Patient Active Problem List   Diagnosis Date Noted   Tear of left gluteus medius tendon 12/28/2019   Fracture, foot, right, closed, initial encounter 11/26/2017   Hypothyroidism 04/28/2017   Glaucoma 04/28/2017   Vitamin D deficiency 04/28/2017   COPD (chronic obstructive pulmonary disease) ([place]) 04/28/2017   Chronic pain in left foot 04/28/2017   Prediabetes 04/28/2017   Osteoarthritis 04/28/2017   Obesity (BMI 30.0-34.9) 04/28/2017    Past Surgical History:  Procedure Laterality Date   BREAST CYST EXCISION Bilateral    neg   FOOT SURGERY     KNEE SURGERY     QUADRICEPS TENDON REPAIR Left 12/28/2019   Procedure: REPAIR OF LEFT GLUTEUS MEDIUS TENDON;  Surgeon: Corky Mull, MD;  Location: [facility];  Service: Orthopedics;  Laterality: Left;   SQUAMOUS CELL CARCINOMA EXCISION     Arm   TONSILLECTOMY      OB History   No obstetric history on file.      Home Medications    Prior to Admission medications   Medication Sig Start Date End Date Taking? Authorizing Provider  acetaminophen (TYLENOL) 500 MG tablet Take 1,000 mg  by mouth 2 (two) times daily as needed for moderate pain or headache.  04/28/17  Yes [provider]  albuterol (PROVENTIL HFA;VENTOLIN HFA) 108 (90 Base) MCG/ACT inhaler Inhale 2 puffs into the lungs every 6 (six) hours as needed for wheezing or shortness of breath.    Yes [provider]  alendronate (FOSAMAX) 70 MG tablet Take 70 mg by mouth every Sunday. Take with a full glass of water on an empty stomach.   Yes [provider]  Calcium Carb-Cholecalciferol (CALCIUM 600 + D PO) Take 2 tablets by mouth 4 (four) times a week.    Yes [provider]  Homeopathic Products (LEG CRAMPS PO) Take 2 tablets by mouth daily as needed (cramps).    Yes [provider]  latanoprost (XALATAN) 0.005 % ophthalmic solution Place 1 drop into both eyes at bedtime.    Yes [provider]  levothyroxine (SYNTHROID) 75 MCG tablet Take by mouth.   Yes [provider]  levothyroxine (SYNTHROID) 88 MCG tablet Take 88 mcg by mouth daily. 01/04/21  Yes [provider]  losartan (COZAAR) 25 MG tablet Take 25 mg by mouth daily.   Yes [provider]  tiotropium (SPIRIVA) 18 MCG inhalation capsule Place 18 mcg into inhaler and inhale daily.   Yes [provider]  benzonatate (  TESSALON) 100 MG capsule Take 2 capsules (200 mg total) by mouth every 8 (eight) hours. 01/21/21   Margarette Canada, NP  doxycycline (VIBRAMYCIN) 100 MG capsule Take 1 capsule (100 mg total) by mouth 2 (two) times daily. 01/21/21   Margarette Canada, NP  ipratropium (ATROVENT) 0.06 % nasal spray Place 2 sprays into both nostrils 4 (four) times daily. 01/21/21   Margarette Canada, NP  promethazine-dextromethorphan (PROMETHAZINE-DM) 6.25-15 MG/5ML syrup Take 5 mLs by mouth 4 (four) times daily as needed. 01/21/21   Margarette Canada, NP    Family History Family History  Problem Relation Age of Onset   Cancer Mother    Hypertension Mother    Diabetes Mother    Breast cancer Mother 64    Heart failure Father    Hypertension Sister    Diabetes Sister    Cancer Sister    Stroke Sister    Breast cancer Sister 75    Social History Social History   Tobacco Use   Smoking status: Former    Types: Cigarettes   Smokeless tobacco: Never  Vaping Use   Vaping Use: Never used  Substance Use Topics   Alcohol use: No   Drug use: No     Allergies   Codeine   Review of Systems Review of Systems  Constitutional: Negative.   Respiratory: Negative.    Cardiovascular: Negative.   Skin:  Positive for wound. Negative for color change, pallor and rash.     Physical Exam Triage Vital Signs ED Triage Vitals  Enc Vitals Group     BP 06/29/22 1413 (!) 144/68     Pulse Rate 06/29/22 1413 68     Resp 06/29/22 1413 18     Temp 06/29/22 1413 98.2 F (36.8 C)     Temp Source 06/29/22 1413 Oral     SpO2 06/29/22 1413 99 %     Weight 06/29/22 1411 185 lb (83.9 kg)     Height 06/29/22 1411 '5\' 3"'$  (1.6 m)     Head Circumference --      Peak Flow --      Pain Score 06/29/22 1411 2     Pain Loc --      Pain Edu? --      Excl. in [place]? --    No data found.  Updated Vital Signs BP (!) 144/68 (BP Location: Left Arm)   Pulse 68   Temp 98.2 F (36.8 C) (Oral)   Resp 18   Ht '5\' 3"'$  (1.6 m)   Wt 185 lb (83.9 kg)   SpO2 99%   BMI 32.77 kg/m   Visual Acuity Right Eye Distance:   Left Eye Distance:   Bilateral Distance:    Right Eye Near:   Left Eye Near:    Bilateral Near:     Physical Exam Constitutional:      Appearance: Normal appearance.  HENT:     Head: Normocephalic.  Eyes:     Extraocular Movements: Extraocular movements intact.  Pulmonary:     Effort: Pulmonary effort is normal.  Skin:    Comments: 2 cm laceration present anterior and lateral aspect of the proximal phalanx of the left index finger, sensation intact, capillary refill less than 3, range of motion intact  Neurological:     Mental Status: She is alert and oriented to person, place, and  time. Mental status is at baseline.  Psychiatric:        Mood and Affect: Mood normal.  Behavior: Behavior normal.      UC Treatments / Results  Labs (all labs ordered are listed, but only abnormal results are displayed) Labs Reviewed - No data to display  EKG   Radiology No results found.  Procedures Laceration Repair  Date/Time: 06/29/2022 3:12 PM  Performed by: Hans Eden, NP Authorized by: Hans Eden, NP   Consent:    Consent obtained:  Verbal   Consent given by:  Patient   Risks, benefits, and alternatives were discussed: yes     Risks discussed:  Infection and pain   Alternatives discussed:  No treatment Universal protocol:    Procedure explained and questions answered to patient or proxy's satisfaction: yes     Patient identity confirmed:  Verbally with patient Anesthesia:    Anesthesia method:  Local infiltration   Local anesthetic:  Lidocaine 1% w/o epi Laceration details:    Location:  Finger   Finger location:  L index finger   Length (cm):  2 Pre-procedure details:    Preparation:  Patient was prepped and draped in usual sterile fashion Exploration:    Limited defect created (wound extended): yes     Wound exploration: entire depth of wound visualized   Treatment:    Area cleansed with:  Povidone-iodine   Irrigation solution:  Sterile water   Irrigation method:  Tap   Debridement:  None   Undermining:  None Skin repair:    Repair method:  Sutures   Suture size:  4-0   Suture material:  Prolene   Suture technique:  Simple interrupted   Number of sutures:  5 Approximation:    Approximation:  Close Repair type:    Repair type:  Simple Post-procedure details:    Dressing:  Non-adherent dressing   Procedure completion:  Tolerated  (including critical care time)  Medications Ordered in UC Medications - No data to display  Initial Impression / Assessment and Plan / UC Course  I have reviewed the triage vital signs and  the nursing notes.  Pertinent labs & imaging results that were available during my care of the patient were reviewed by me and considered in my medical decision making (see chart for details).  Laceration of left index finger without foreign body without damage to nail, subsequent encounter  Tetanus shot 2014, patient would like to hold off as she is still within 10-year window and  will receive vaccination from her PCP , 5 sutures placed, advised to return in 10 to 14 days for removal, may remove bandage applied in office in the morning, recommended cleansing with diluted soapy water during normal hygiene, pat dry, may leave open to air, may cover with nonadherent dressing if at risk for contamination, given strict precautions to return for any signs of infection, may use over-the-counter treatments for management of discomfort Final Clinical Impressions(s) / UC Diagnoses   Final diagnoses:  Laceration of left index finger without foreign body without damage to nail, subsequent encounter     Discharge Instructions      5 Sutures have been placed, please return in 10 to 14 days for removal  Please keep area dry for the next 24 hours, may get wet during normal hygiene, avoid dishwashing, baths  You may cleanse area daily during normal hygiene using diluted soapy water, pat dry and then may leave open to air, may cover with a Band-Aid if any concerns for site getting dirty  Please return for reevaluation for increased swelling, increased pain, limited movement  of finger, numbness, tingling or drainage   ED Prescriptions   None    PDMP not reviewed this encounter.   Hans Eden, NP 06/29/22 1515

## 2022-07-10 ENCOUNTER — Other Ambulatory Visit: Payer: Self-pay | Admitting: Family Medicine

## 2022-07-10 DIAGNOSIS — E119 Type 2 diabetes mellitus without complications: Secondary | ICD-10-CM

## 2022-07-10 DIAGNOSIS — Z9189 Other specified personal risk factors, not elsewhere classified: Secondary | ICD-10-CM

## 2022-07-23 ENCOUNTER — Other Ambulatory Visit: Payer: Medicare Other

## 2022-07-31 ENCOUNTER — Ambulatory Visit
Admission: RE | Admit: 2022-07-31 | Discharge: 2022-07-31 | Disposition: A | Discharge status: Home / Self Care | Payer: Medicare Other | Source: Ambulatory Visit | Attending: Family Medicine | Admitting: Family Medicine

## 2022-07-31 DIAGNOSIS — Z9189 Other specified personal risk factors, not elsewhere classified: Secondary | ICD-10-CM | POA: Insufficient documentation

## 2022-07-31 DIAGNOSIS — E119 Type 2 diabetes mellitus without complications: Secondary | ICD-10-CM | POA: Insufficient documentation

## 2022-07-31 DIAGNOSIS — I10 Essential (primary) hypertension: Secondary | ICD-10-CM | POA: Insufficient documentation

## 2022-08-07 ENCOUNTER — Other Ambulatory Visit: Payer: Self-pay | Admitting: Family Medicine

## 2022-08-07 DIAGNOSIS — I2510 Atherosclerotic heart disease of native coronary artery without angina pectoris: Secondary | ICD-10-CM

## 2022-08-07 DIAGNOSIS — R911 Solitary pulmonary nodule: Secondary | ICD-10-CM

## 2022-08-14 ENCOUNTER — Other Ambulatory Visit: Payer: Medicare Other

## 2022-08-21 ENCOUNTER — Ambulatory Visit
Admission: RE | Admit: 2022-08-21 | Discharge: 2022-08-21 | Disposition: A | Discharge status: Home / Self Care | Payer: Medicare Other | Source: Ambulatory Visit | Attending: Family Medicine | Admitting: Family Medicine

## 2022-08-21 DIAGNOSIS — I2510 Atherosclerotic heart disease of native coronary artery without angina pectoris: Secondary | ICD-10-CM

## 2022-08-21 DIAGNOSIS — R911 Solitary pulmonary nodule: Secondary | ICD-10-CM

## 2022-08-26 ENCOUNTER — Other Ambulatory Visit: Payer: Self-pay | Admitting: Family Medicine

## 2022-08-26 DIAGNOSIS — R918 Other nonspecific abnormal finding of lung field: Secondary | ICD-10-CM

## 2022-11-08 ENCOUNTER — Other Ambulatory Visit: Payer: Self-pay

## 2022-11-08 DIAGNOSIS — Z1231 Encounter for screening mammogram for malignant neoplasm of breast: Secondary | ICD-10-CM

## 2022-12-03 ENCOUNTER — Ambulatory Visit
Admission: RE | Admit: 2022-12-03 | Discharge: 2022-12-03 | Disposition: A | Discharge status: Home / Self Care | Payer: Medicare Other | Source: Ambulatory Visit | Attending: Family Medicine | Admitting: Family Medicine

## 2022-12-03 DIAGNOSIS — Z1231 Encounter for screening mammogram for malignant neoplasm of breast: Secondary | ICD-10-CM | POA: Diagnosis present

## 2023-02-09 ENCOUNTER — Other Ambulatory Visit: Payer: Self-pay

## 2023-02-09 ENCOUNTER — Encounter: Payer: Self-pay | Admitting: Emergency Medicine

## 2023-02-09 ENCOUNTER — Emergency Department
Admission: EM | Admit: 2023-02-09 | Discharge: 2023-02-09 | Disposition: A | Discharge status: Home / Self Care | Payer: Medicare Other | Attending: Student in an Organized Health Care Education/Training Program | Admitting: Student in an Organized Health Care Education/Training Program

## 2023-02-09 ENCOUNTER — Emergency Department: Payer: Medicare Other

## 2023-02-09 DIAGNOSIS — S7002XA Contusion of left hip, initial encounter: Secondary | ICD-10-CM | POA: Insufficient documentation

## 2023-02-09 DIAGNOSIS — S79912A Unspecified injury of left hip, initial encounter: Secondary | ICD-10-CM | POA: Diagnosis present

## 2023-02-09 DIAGNOSIS — W11XXXA Fall on and from ladder, initial encounter: Secondary | ICD-10-CM | POA: Diagnosis not present

## 2023-02-09 DIAGNOSIS — Y92009 Unspecified place in unspecified non-institutional (private) residence as the place of occurrence of the external cause: Secondary | ICD-10-CM | POA: Diagnosis not present

## 2023-02-09 DIAGNOSIS — W19XXXA Unspecified fall, initial encounter: Secondary | ICD-10-CM

## 2023-02-09 MED ORDER — LIDOCAINE 5 % EX PTCH
1.0000 | MEDICATED_PATCH | Freq: Once | CUTANEOUS | Status: DC
  Administered 2023-02-09: 1 via TRANSDERMAL
  Filled 2023-02-09: qty 1

## 2023-02-09 NOTE — ED Provider Notes (Signed)
[facility] Emergency Department Provider Note     Event Date/Time   First MD Initiated Contact with Patient 02/09/23 2221     (approximate)   History   Fall   HPI  Catherine Ray is a 77 y.o. female who lives alone, presents to the ED for evaluation of injury sustained following a mechanical fall pain patient reports she was about 2 feet off the ground using a short stepladder when she missed a step counting backwards landing primarily on her left buttocks and hip.  She also hit her right elbow on a chair near her.  She denies any head injury or LOC patient also denies any frank back pain.  Patient's remote history is that of a gluteal muscle tendon tear status postrepair some 2 years prior.  Denies any bladder or bowel incontinence, foot drop, or saddle anesthesia.  Patient denies any headache, weakness, or dizziness.     Physical Exam   Triage Vital Signs: ED Triage Vitals  Enc Vitals Group     BP 02/09/23 2103 137/69     Pulse Rate 02/09/23 2103 83     Resp 02/09/23 2103 18     Temp 02/09/23 2103 97.9 F (36.6 C)     Temp Source 02/09/23 2103 Oral     SpO2 02/09/23 2103 100 %     Weight 02/09/23 2101 190 lb (86.2 kg)     Height 02/09/23 2101 5' 3.5" (1.613 m)     Head Circumference --      Peak Flow --      Pain Score 02/09/23 2101 6     Pain Loc --      Pain Edu? --      Excl. in GC? --     Most recent vital signs: Vitals:   02/09/23 2103 02/09/23 2251  BP: 137/69 130/65  Pulse: 83 80  Resp: 18 18  Temp: 97.9 F (36.6 C)   SpO2: 100% 98%    General Awake, no distress. NAD HEENT NCAT. PERRL. EOMI. No rhinorrhea. Mucous membranes are moist.  CV:  Good peripheral perfusion. RRR RESP:  Normal effort. CTA ABD:  No distention.  MSK:  Spinal alignment without midline tenderness, spasm, deformity, or step-off.  Patient active range of motion of the left hip with normal flexion and extension range.  No tenderness to palpation with  internal/external rotation.  She ambulates without difficulty. NEURO: Cranial nerves II to XII grossly intact.  Normal LE DTRs bilaterally.   ED Results / Procedures / Treatments   Labs (all labs ordered are listed, but only abnormal results are displayed) Labs Reviewed - No data to display   EKG   RADIOLOGY  I personally viewed and evaluated these images as part of my medical decision making, as well as reviewing the written report by the radiologist.  ED Provider Interpretation: no acute findings  DG Elbow Complete Right  Result Date: 02/09/2023 CLINICAL DATA:  Right elbow pain after fall.  Elbow bruising. EXAM: RIGHT ELBOW - COMPLETE 3+ VIEW COMPARISON:  None Available. FINDINGS: There is no evidence of fracture, dislocation, or joint effusion. There is no evidence of arthropathy or other focal bone abnormality. Soft tissues are unremarkable. IMPRESSION: No fracture or dislocation of the right elbow. Electronically Signed   By: Narda Rutherford M.D.   On: 02/09/2023 21:40   DG Hip Unilat W or Wo Pelvis 2-3 Views Left  Result Date: 02/09/2023 CLINICAL DATA:  Fall with left hip  pain. EXAM: DG HIP (WITH OR WITHOUT PELVIS) 2-3V LEFT COMPARISON:  Hip MRI 10/30/2020 FINDINGS: No acute fracture of the hip and pelvis. The femoral head is well seated, no dislocation. Surgical anchors in the greater trochanter as before. Pubic rami appear intact. No diastasis of pubic symphysis or sacroiliac joints. Unremarkable soft tissues. IMPRESSION: No acute fracture of the hip or left pelvis. Electronically Signed   By: Narda Rutherford M.D.   On: 02/09/2023 21:38     PROCEDURES:  Critical Care performed: No  Procedures   MEDICATIONS ORDERED IN ED: Medications  lidocaine (LIDODERM) 5 % 1 patch (1 patch Transdermal Patch Applied 02/09/23 2242)     IMPRESSION / MDM / ASSESSMENT AND PLAN / ED COURSE  I reviewed the triage vital signs and the nursing notes.                               Differential diagnosis includes, but is not limited to, hip contusion, bursitis, hip fracture, hip dislocation, pelvic fracture  Patient's presentation is most consistent with acute complicated illness / injury requiring diagnostic workup.  Patient's diagnosis is consistent with mechanical fall resulting in a hip contusion.  No radiologic evidence of any acute fracture or dislocation to the left hip or pelvic girdle, based on interpretation of images.  Patient ambulates well and denies any significant pain at this time.  Patient will be discharged home with directions to take OTC Tylenol as needed. Patient is to follow up with her primary provider, as needed or otherwise directed. Patient is given ED precautions to return to the ED for any worsening or new symptoms.     FINAL CLINICAL IMPRESSION(S) / ED DIAGNOSES   Final diagnoses:  Fall in home, initial encounter  Contusion of left hip, initial encounter     Rx / DC Orders   ED Discharge Orders     None        Note:  This document was prepared using Dragon voice recognition software and may include unintentional dictation errors.    Lissa Hoard, PA-C 02/10/23 0023    Willy Eddy, MD 02/13/23 1110

## 2023-02-09 NOTE — ED Triage Notes (Signed)
Pt presents ambulatory to triage via POV with complaints of fall tonight ~2 feet off a ladder. Pt states she missed a step and fell backwards landing on her L hip and hit her R elbow on a chair. Mild bruising to the elbow. No LOC, did not hit her head, not on thinners. A&Ox4 at this time. Denies CP or SOB.

## 2023-02-09 NOTE — ED Notes (Signed)
Pt verbalizes understanding of discharge instructions. Opportunity for questioning and answers were provided. Pt discharged from ED to home with daughter.    

## 2023-02-09 NOTE — Discharge Instructions (Addendum)
Your exam and x-ray are normal and reassuring but no signs of a fracture or injury or dislocation related to your fall.  Take OTC Tylenol as discussed.  Follow-up with your primary provider for ongoing concerns.  Use the lidocaine patches as needed.

## 2023-02-10 ENCOUNTER — Ambulatory Visit
Admission: RE | Admit: 2023-02-10 | Discharge: 2023-02-10 | Disposition: A | Discharge status: Home / Self Care | Payer: Medicare Other | Source: Ambulatory Visit | Attending: Family Medicine | Admitting: Family Medicine

## 2023-02-10 DIAGNOSIS — R918 Other nonspecific abnormal finding of lung field: Secondary | ICD-10-CM

## 2023-09-03 IMAGING — MG MM DIGITAL SCREENING BILAT W/ TOMO AND CAD
6 of 10 series · 6 of 30 positions shown · non-contrast
Comparison: Previous exam(s).

CLINICAL DATA: Screening.

EXAM:
DIGITAL SCREENING BILATERAL MAMMOGRAM WITH TOMOSYNTHESIS AND CAD
TECHNIQUE: Bilateral screening digital craniocaudal and mediolateral oblique
mammograms were obtained. Bilateral screening digital breast
tomosynthesis was performed. The images were evaluated with
computer-aided detection.

[R CC synth-2D]
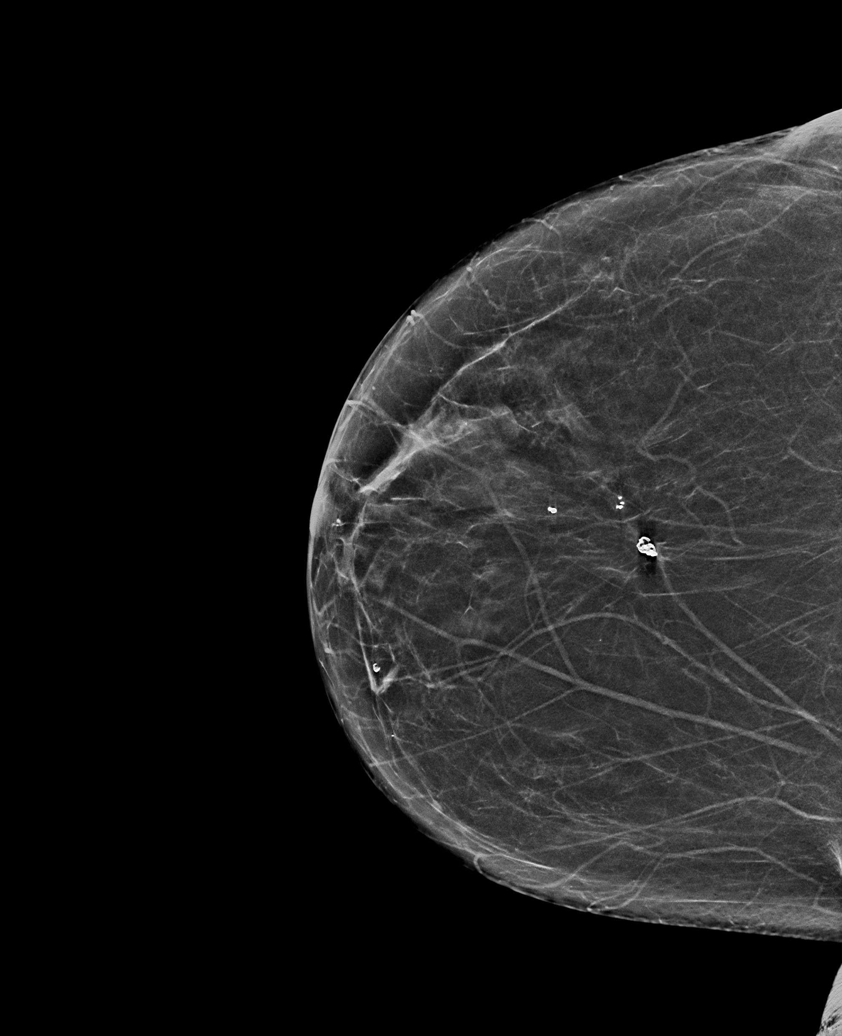

[L MLO synth-2D]
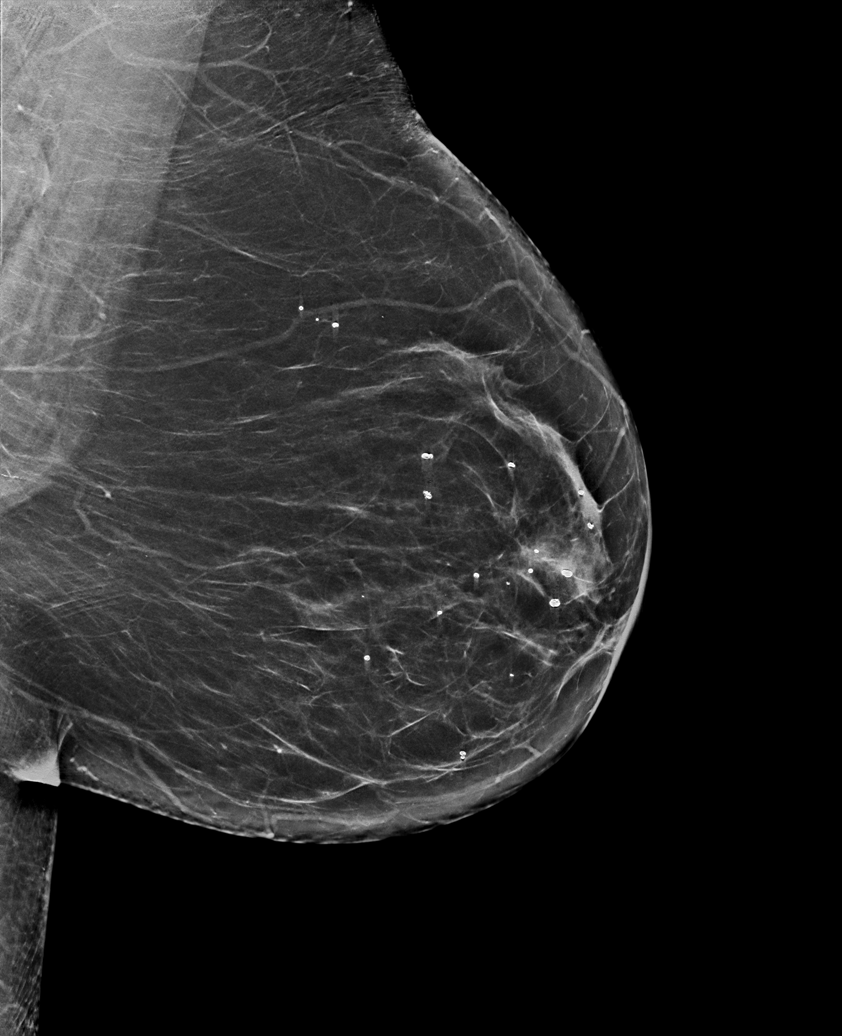

[R MLO synth-2D (1 of 2)]
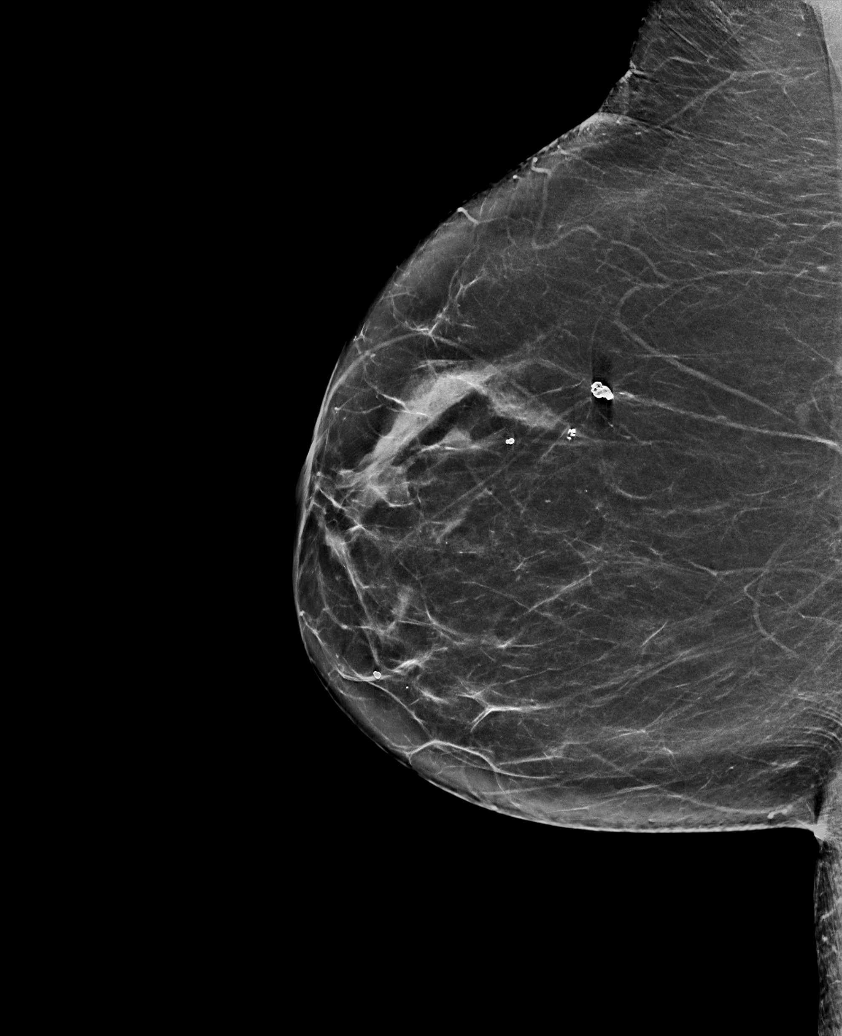

[R MLO synth-2D (2 of 2)]
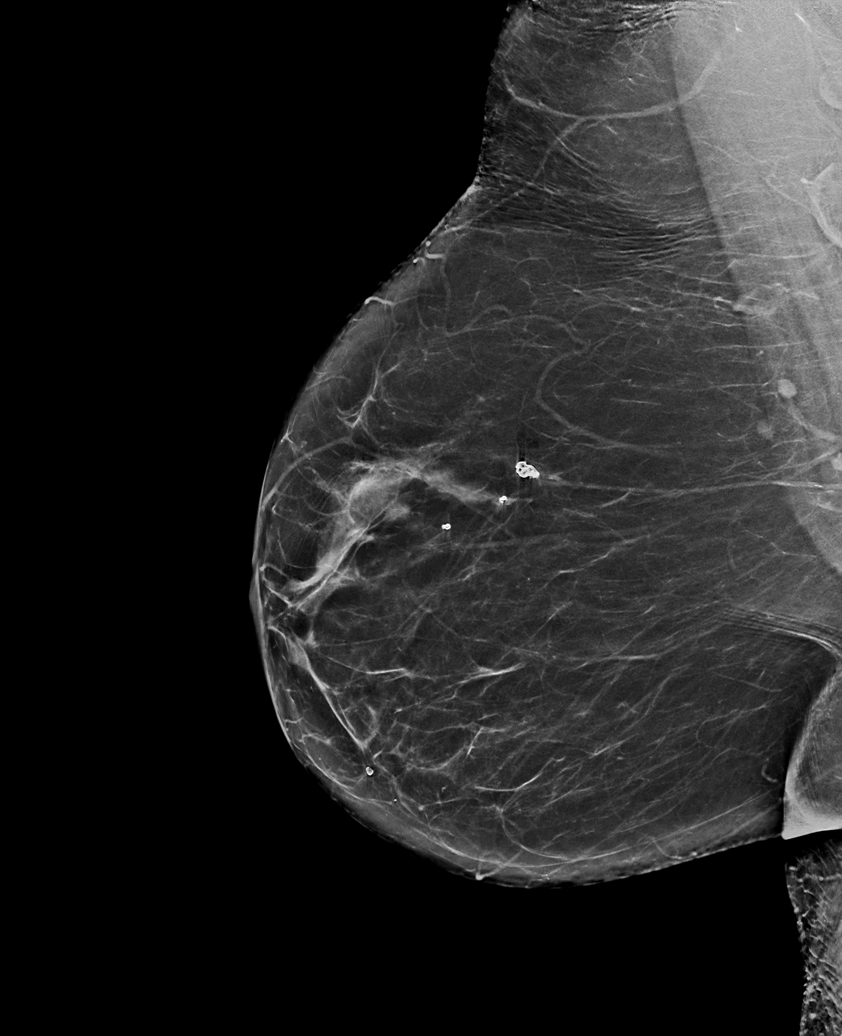

[L CC synth-2D]
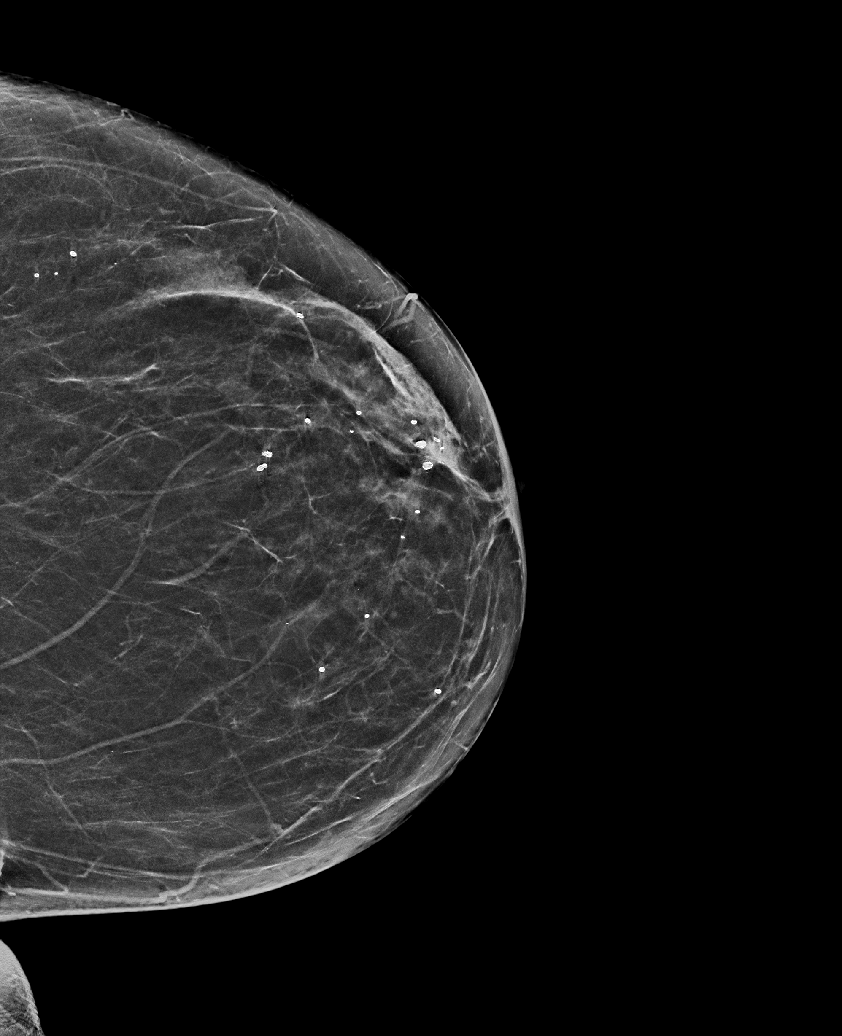

[R MLO tomo · tomo slice 35/70.0]
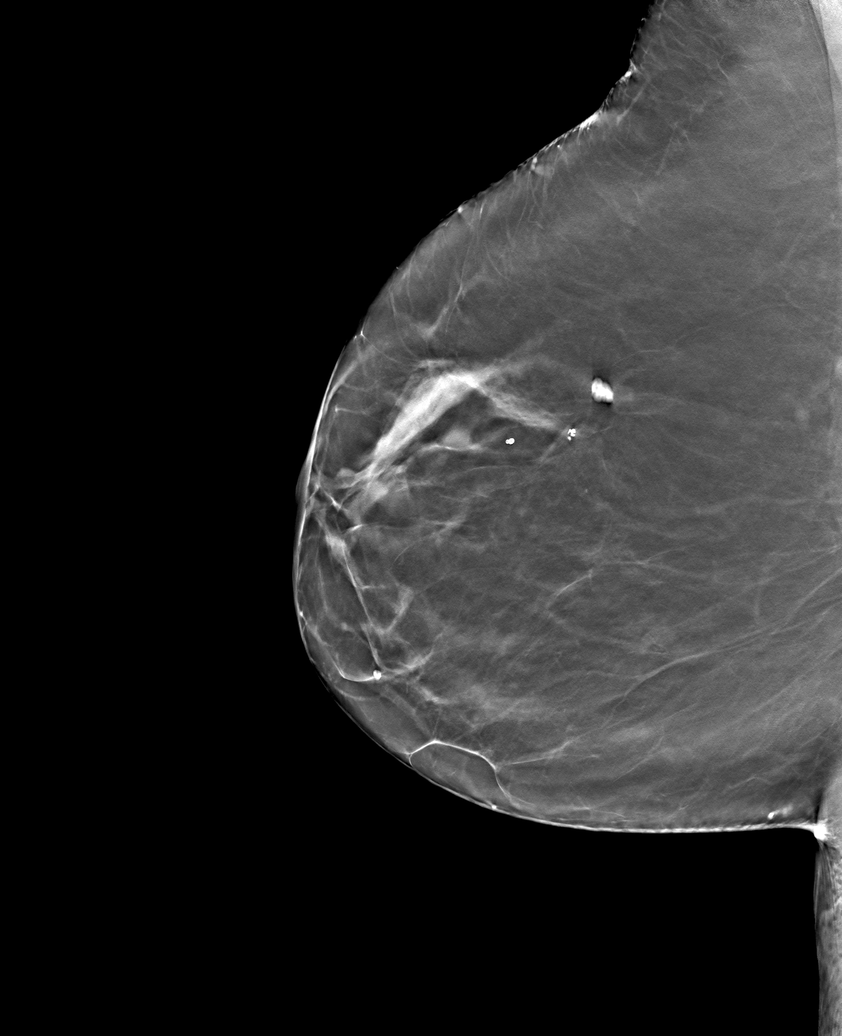

[6 of 30 positions shown; findings below may reference images not displayed]

ACR Breast Density Category b: There are scattered areas of
fibroglandular density.
FINDINGS: There are no findings suspicious for malignancy.
IMPRESSION: No mammographic evidence of malignancy. A result letter of this
screening mammogram will be mailed directly to the patient.

RECOMMENDATION:
Screening mammogram in one year. (Code:51-O-LD2)

BI-RADS CATEGORY  1: Negative.

## 2023-12-26 ENCOUNTER — Other Ambulatory Visit: Payer: Self-pay | Admitting: Family Medicine

## 2023-12-26 DIAGNOSIS — Z1231 Encounter for screening mammogram for malignant neoplasm of breast: Secondary | ICD-10-CM

## 2024-01-14 ENCOUNTER — Ambulatory Visit
Admission: RE | Admit: 2024-01-14 | Discharge: 2024-01-14 | Disposition: A | Discharge status: Home / Self Care | Source: Ambulatory Visit | Attending: Family Medicine | Admitting: Family Medicine

## 2024-01-14 DIAGNOSIS — Z1231 Encounter for screening mammogram for malignant neoplasm of breast: Secondary | ICD-10-CM | POA: Insufficient documentation

## 2024-01-27 ENCOUNTER — Other Ambulatory Visit: Payer: Self-pay | Admitting: Family Medicine

## 2024-01-27 DIAGNOSIS — Z9181 History of falling: Secondary | ICD-10-CM

## 2024-01-27 DIAGNOSIS — R911 Solitary pulmonary nodule: Secondary | ICD-10-CM

## 2024-02-04 ENCOUNTER — Other Ambulatory Visit: Payer: Self-pay | Admitting: Physician Assistant

## 2024-02-04 ENCOUNTER — Ambulatory Visit
Admission: RE | Admit: 2024-02-04 | Discharge: 2024-02-04 | Disposition: A | Discharge status: Home / Self Care | Source: Ambulatory Visit | Attending: Physician Assistant | Admitting: Physician Assistant

## 2024-02-04 DIAGNOSIS — R1032 Left lower quadrant pain: Secondary | ICD-10-CM | POA: Diagnosis present

## 2024-02-04 MED ORDER — IOHEXOL 300 MG/ML  SOLN
100.0000 mL | Freq: Once | INTRAMUSCULAR | Status: AC | PRN
  Administered 2024-02-04: 100 mL via INTRAVENOUS

## 2024-02-10 NOTE — H&P (View-Only) (Signed)
 History of Present Illness Catherine Ray is a 78 year old female with a left inguinal hernia who presents with groin pain.  She experiences intermittent groin pain associated with a known left inguinal hernia, with the most intense episode occurring two to three weeks ago while shopping, necessitating her to sit down for about half an hour until the pain subsided. The pain is localized to the left groin and has not been observed on the right side. No bulge has been noticed during these episodes.  A CT scan performed on February 04, 2024, revealed a small left inguinal hernia containing small bowel, with no evidence of incarceration or obstruction.  I personally evaluated the images.  She has a history of tendon surgery on the left side for a torn gluteus medius, which was repaired with pins about four years ago. She also mentions having an enlarged ovary on the left side.  She reports episodes of vomiting and diarrhea occurring simultaneously after eating certain foods, which she associates with her last colonoscopy. These episodes have been infrequent and only occur when she experiences diarrhea from food.  Her current medications include Tylenol , which she takes up to six times a day due to a kidney condition that limits her use of other pain medications. She mentions a kidney function number of 52.  She also has history of complicated left ovarian cyst that was recommended to be removed 4 years ago.      PAST MEDICAL HISTORY:  Past Medical History:  Diagnosis Date  . Allergy   . COPD (chronic obstructive pulmonary disease) (CMS/HHS-HCC)   . Glaucoma   . History of cataract   . Shingles 09/2019  . Thyroid  disease         PAST SURGICAL HISTORY:   Past Surgical History:  Procedure Laterality Date  . Primary repair of near full-thickness left gluteus medius tendon tear. Left 12/28/2019   Dr.Poggi   . COLONOSCOPY  07/11/2020   Tubular adenomas/PHx CP/Repeat 3yrs/TKT  . CATARACT EXTRACTION  Bilateral 2024  . fibrocystic breast surgery    . KNEE ARTHROSCOPY    . plantar fascial release right foot    . TONSILLECTOMY           MEDICATIONS:  Outpatient Encounter Medications as of 02/10/2024  Medication Sig Dispense Refill  . acetaminophen  (TYLENOL ) 500 MG tablet Take 1,000 mg by mouth every 8 (eight) hours as needed    . albuterol  MDI, PROVENTIL , VENTOLIN , PROAIR , HFA (VENTOLIN  HFA) 90 mcg/actuation inhaler Inhale 2 inhalations into the lungs every 6 (six) hours as needed for Wheezing 17 each 3  . calcium  carbonate-vitamin D3 (CALTRATE 600+D) 600 mg-10 mcg (400 unit) tablet Take 1 tablet by mouth 2 (two) times daily with meals    . cetirizine (ZYRTEC) 10 MG tablet Take 10 mg by mouth once daily    . latanoprost  (XALATAN ) 0.005 % ophthalmic solution Place 1 drop into both eyes at bedtime    . levothyroxine  (SYNTHROID ) 75 MCG tablet TAKE 1 TABLET BY MOUTH ON SATURDAYS AND SUNDAYS BEFORE BREAKFAST ON EMPTY STOMACH WITH FULL GLASS OF WATER 25 tablet 1  . levothyroxine  (SYNTHROID ) 88 MCG tablet TAKE 1 TABLET BY MOUTH DAILY MONDAY THROUGH FRIDAY ON AN EMPTY STOMACH WITH A GLASS OF WATER 30-60 MINUTES BEFORE BREAKFAST 64 tablet 1  . losartan (COZAAR) 25 MG tablet TAKE 1 TABLET(25 MG) BY MOUTH AT BEDTIME 100 tablet 1  . rosuvastatin (CRESTOR) 5 MG tablet Take 1 tablet (5 mg total) by mouth once  daily 100 tablet 1  . tiotropium (SPIRIVA  WITH HANDIHALER) 18 mcg inhalation capsule Place 1 capsule (18 mcg total) into inhaler and inhale once daily 30 capsule 3   No facility-administered encounter medications on file as of 02/10/2024.     ALLERGIES:   Codeine   SOCIAL HISTORY:  Social History   Socioeconomic History  . Marital status: Widowed  Occupational History  . Occupation: Retired   Tobacco Use  . Smoking status: Former    Current packs/day: 0.00    Average packs/day: 1 pack/day for 30.0 years (30.0 ttl pk-yrs)    Types: Cigarettes    Start date: 05/09/1959    Quit date:  05/08/1989    Years since quitting: 34.7    Passive exposure: Past  . Smokeless tobacco: Never  Vaping Use  . Vaping status: Never Used  Substance and Sexual Activity  . Alcohol use: No  . Drug use: No  . Sexual activity: Not Currently    Birth control/protection: Post-menopausal  Social History Narrative   Religious Affiliation: Curator [facility])   Social Drivers of Health   Financial Resource Strain: Low Risk  (02/10/2024)   Overall Financial Resource Strain (CARDIA)   . Difficulty of Paying Living Expenses: Not very hard  Food Insecurity: No Food Insecurity (02/10/2024)   Hunger Vital Sign   . Worried About Programme researcher, broadcasting/film/video in the Last Year: Never true   . Ran Out of Food in the Last Year: Never true  Transportation Needs: No Transportation Needs (02/10/2024)   PRAPARE - Transportation   . Lack of Transportation (Medical): No   . Lack of Transportation (Non-Medical): No    FAMILY HISTORY:  Family History  Problem Relation Name Age of Onset  . Heart disease Mother Mother   . Breast cancer Mother Mother   . Diabetes Mother Mother   . Thyroid  disease Mother Mother   . Heart disease Sister SISTER1   . Breast cancer Sister SISTER1   . Stroke Sister SISTER1   . Diabetes Sister ATANACIO   . Heart disease Father    . Stroke Sister SISTER2   . Diabetes Sister OSVALDO   . Heart disease Sister SISTER2      GENERAL REVIEW OF SYSTEMS:   General ROS: negative for - chills, fatigue, fever, weight gain or weight loss Allergy and Immunology ROS: negative for - hives  Hematological and Lymphatic ROS: negative for - bleeding problems or bruising, negative for palpable nodes Endocrine ROS: negative for - heat or cold intolerance, hair changes Respiratory ROS: negative for - cough, shortness of breath or wheezing Cardiovascular ROS: no chest pain or palpitations GI ROS: negative for nausea, vomiting, abdominal pain, diarrhea, constipation Musculoskeletal ROS: negative for - joint  swelling or muscle pain Neurological ROS: negative for - confusion, syncope Dermatological ROS: negative for pruritus and rash  PHYSICAL EXAM:  Vitals:   02/10/24 1019  BP: (!) 157/88  Pulse: 71  .  Ht:161.3 cm (5' 3.5) Wt:91.2 kg (201 lb) ADJ:Anib surface area is 2.02 meters squared. Body mass index is 35.05 kg/m.SABRA   GENERAL: Alert, active, oriented x3  HEENT: Pupils equal reactive to light. Extraocular movements are intact. Sclera clear. Palpebral conjunctiva normal red color.Pharynx clear.  NECK: Supple with no palpable mass and no adenopathy.  LUNGS: Sound clear with no rales rhonchi or wheezes.  HEART: Regular rhythm S1 and S2 without murmur.  ABDOMEN: Soft and depressible, nontender with no palpable mass, no hepatomegaly. Wounds dry and clean.  EXTREMITIES:  Well-developed well-nourished symmetrical with no dependent edema.  NEUROLOGICAL: Awake alert oriented, facial expression symmetrical, moving all extremities.   Results RADIOLOGY Abdominal CT: Small left inguinal hernia containing small bowel, no evidence of incarceration (02/04/2024)    Assessment & Plan Left inguinal hernia   A CT scan from February 04, 2024, showed a small left inguinal hernia containing small bowel, without incarceration or obstruction. She experiences intermittent groin pain likely due to the hernia, with a risk of incarceration that could necessitate emergency surgery. Schedule laparoscopic hernia repair to prevent complications like strangulation and bowel resection. Discuss the potential combination of hernia repair with ovarian surgery with Dr. Scotty. Prescribe hydrocodone with tramadol  for post-operative pain management and provide pre-operative instructions.  Left complex ovarian cyst Recommend to be evaluated by gynecology.  If they still recommend excision of left ovary, will consider coordinating procedure together to multiple anesthesia to the patient   Non-recurrent unilateral  inguinal hernia without obstruction or gangrene [K40.90]          Patient verbalized understanding, all questions were answered, and were agreeable with the plan outlined above.   Lucas Sjogren, MD  Electronically signed by Lucas Sjogren, MD

## 2024-02-17 ENCOUNTER — Other Ambulatory Visit: Payer: Self-pay

## 2024-02-17 ENCOUNTER — Encounter
Admission: RE | Admit: 2024-02-17 | Discharge: 2024-02-17 | Disposition: A | Discharge status: Home / Self Care | Source: Ambulatory Visit | Attending: General Surgery | Admitting: General Surgery

## 2024-02-17 ENCOUNTER — Other Ambulatory Visit

## 2024-02-17 ENCOUNTER — Ambulatory Visit: Payer: Self-pay | Admitting: General Surgery

## 2024-02-17 DIAGNOSIS — R7303 Prediabetes: Secondary | ICD-10-CM

## 2024-02-17 DIAGNOSIS — E66811 Obesity, class 1: Secondary | ICD-10-CM

## 2024-02-17 DIAGNOSIS — J449 Chronic obstructive pulmonary disease, unspecified: Secondary | ICD-10-CM

## 2024-02-17 DIAGNOSIS — Z01818 Encounter for other preprocedural examination: Secondary | ICD-10-CM

## 2024-02-17 HISTORY — DX: Hypothyroidism, unspecified: E03.9

## 2024-02-17 HISTORY — DX: Chronic kidney disease, stage 3 unspecified: N18.30

## 2024-02-17 HISTORY — DX: Prediabetes: R73.03

## 2024-02-17 NOTE — Patient Instructions (Addendum)
 Your procedure is scheduled on: FRIDAY 02/20/24 Report to the Registration Desk on the 1st floor of the [facility]. To find out your arrival time, please call [phone] between 1PM - 3PM on: THURSDAY 02/19/24 If your arrival time is 6:00 am, do not arrive before that time as the [facility] entrance doors do not open until 6:00 am.  REMEMBER: Instructions that are not followed completely may result in serious medical risk, up to and including death; or upon the discretion of your surgeon and anesthesiologist your surgery may need to be rescheduled.  Do not eat food after midnight the night before surgery.  No gum chewing or hard candies.  You may however, drink CLEAR liquids up to 2 hours before you are scheduled to arrive for your surgery. Do not drink anything within 2 hours of your scheduled arrival time.  Clear liquids include: - water  - apple juice without pulp - gatorade (not RED colors) - black coffee or tea (Do NOT add milk or creamers to the coffee or tea) Do NOT drink anything that is not on this list.  One week prior to surgery: Stop Anti-inflammatories (NSAIDS) such as Advil, Aleve, Ibuprofen, Motrin, Naproxen, Naprosyn and Aspirin based products such as Excedrin, Goody's Powder, BC Powder.  You may however, continue to take Tylenol  if needed for pain up until the day of surgery.  Stop ALL OVER THE COUNTER supplements and vitamins until after surgery.  Continue taking all of your other prescription medications up until the day of surgery.  ON THE DAY OF SURGERY ONLY TAKE THESE MEDICATIONS WITH SIPS OF WATER:  levothyroxine  (SYNTHROID ) 88 MCG tablet  cetirizine (ZYRTEC) 10 MG tablet   Use inhalers on the day of surgery and bring to the hospital.  No Alcohol for 24 hours before or after surgery.  No Smoking including e-cigarettes for 24 hours before surgery.  No chewable tobacco products for at least 6 hours before surgery.  No nicotine patches on the day of  surgery.  Do not use any recreational drugs for at least a week (preferably 2 weeks) before your surgery.  Please be advised that the combination of cocaine and anesthesia may have negative outcomes, up to and including death. If you test positive for cocaine, your surgery will be cancelled.  On the morning of surgery brush your teeth with toothpaste and water, you may rinse your mouth with mouthwash if you wish. Do not swallow any toothpaste or mouthwash.  Use CHG Soap or wipes as directed on instruction sheet.  Do not shave body hair from the neck down 48 hours before surgery.  Do not wear lotions, powders, or perfumes.   Wear comfortable clothing (specific to your surgery type) to the hospital.  Do not wear jewelry, make-up, hairpins, clips or nail polish.  For welded (permanent) jewelry: bracelets, anklets, waist bands, etc.  Please have this removed prior to surgery.  If it is not removed, there is a chance that hospital personnel will need to cut it off on the day of surgery.  Contact lenses, hearing aids and dentures may not be worn into surgery. BRING A CASE FOR YOUR GLASSES  Do not bring valuables to the hospital. [facility] is not responsible for any missing/lost belongings or valuables.   Notify your doctor if there is any change in your medical condition (cold, fever, infection).  After surgery, you can help prevent lung complications by doing breathing exercises.  Take deep breaths and cough every 1-2 hours.  Your doctor may order a device called an Incentive Spirometer to help you take deep breaths. When coughing or sneezing, hold a pillow firmly against your incision with both hands. This is called "splinting." Doing this helps protect your incision. It also decreases belly discomfort.  If you are being discharged the day of surgery, you will not be allowed to drive home. You will need a responsible individual to drive you home and stay with you for 24 hours after  surgery.   Please call the Pre-admissions Testing Dept. at [phone] if you have any questions about these instructions.  Surgery Visitation Policy:  Patients having surgery or a procedure may have two visitors.  Children under the age of 51 must have an adult with them who is not the patient.   Merchandiser, retail to address health-related social needs:  [email or web address]     Preparing for Surgery with CHLORHEXIDINE  GLUCONATE (CHG) Soap  Chlorhexidine  Gluconate (CHG) Soap  o An antiseptic cleaner that kills germs and bonds with the skin to continue killing germs even after washing  o Used for showering the night before surgery and morning of surgery  Before surgery, you can play an important role by reducing the number of germs on your skin.  CHG (Chlorhexidine  gluconate) soap is an antiseptic cleanser which kills germs and bonds with the skin to continue killing germs even after washing.  Please do not use if you have an allergy to CHG or antibacterial soaps. If your skin becomes reddened/irritated stop using the CHG.  1. Shower the NIGHT BEFORE SURGERY and the MORNING OF SURGERY with CHG soap.  2. If you choose to wash your hair, wash your hair first as usual with your normal shampoo.  3. After shampooing, rinse your hair and body thoroughly to remove the shampoo.  4. Use CHG as you would any other liquid soap. You can apply CHG directly to the skin and wash gently with a scrungie or a clean washcloth.  5. Apply the CHG soap to your body only from the neck down. Do not use on open wounds or open sores. Avoid contact with your eyes, ears, mouth, and genitals (private parts). Wash face and genitals (private parts) with your normal soap.  6. Wash thoroughly, paying special attention to the area where your surgery will be performed.  7. Thoroughly rinse your body with warm water.  8. Do not shower/wash with your normal soap after using and rinsing  off the CHG soap.  9. Pat yourself dry with a clean towel.  10. Wear clean pajamas to bed the night before surgery.  12. Place clean sheets on your bed the night of your first shower and do not sleep with pets.  13. Shower again with the CHG soap on the day of surgery prior to arriving at the hospital.  14. Do not apply any deodorants/lotions/powders.  15. Please wear clean clothes to the hospital.

## 2024-02-18 ENCOUNTER — Encounter
Admission: RE | Admit: 2024-02-18 | Discharge: 2024-02-18 | Disposition: A | Discharge status: Home / Self Care | Source: Ambulatory Visit | Attending: General Surgery | Admitting: General Surgery

## 2024-02-18 DIAGNOSIS — R7303 Prediabetes: Secondary | ICD-10-CM | POA: Diagnosis not present

## 2024-02-18 DIAGNOSIS — Z01818 Encounter for other preprocedural examination: Secondary | ICD-10-CM | POA: Diagnosis present

## 2024-02-18 DIAGNOSIS — E66811 Obesity, class 1: Secondary | ICD-10-CM | POA: Diagnosis not present

## 2024-02-18 DIAGNOSIS — Z01810 Encounter for preprocedural cardiovascular examination: Secondary | ICD-10-CM | POA: Diagnosis not present

## 2024-02-18 DIAGNOSIS — R9431 Abnormal electrocardiogram [ECG] [EKG]: Secondary | ICD-10-CM | POA: Insufficient documentation

## 2024-02-18 DIAGNOSIS — J449 Chronic obstructive pulmonary disease, unspecified: Secondary | ICD-10-CM | POA: Insufficient documentation

## 2024-02-19 MED ORDER — LACTATED RINGERS IV SOLN: INTRAVENOUS | Status: DC

## 2024-02-19 MED ORDER — CHLORHEXIDINE GLUCONATE 0.12 % MT SOLN
15.0000 mL | Freq: Once | OROMUCOSAL | Status: AC
  Administered 2024-02-20: 15 mL via OROMUCOSAL

## 2024-02-19 MED ORDER — ORAL CARE MOUTH RINSE: 15.0000 mL | Freq: Once | OROMUCOSAL | Status: AC

## 2024-02-19 MED ORDER — CEFAZOLIN SODIUM-DEXTROSE 2-4 GM/100ML-% IV SOLN
2.0000 g | INTRAVENOUS | Status: AC
  Administered 2024-02-20: 2 g via INTRAVENOUS

## 2024-02-20 ENCOUNTER — Encounter: Payer: Self-pay | Admitting: General Surgery

## 2024-02-20 ENCOUNTER — Encounter
Admission: RE | Disposition: A | Discharge status: Home / Self Care | Payer: Self-pay | Source: Ambulatory Visit | Attending: General Surgery

## 2024-02-20 ENCOUNTER — Ambulatory Visit
Admission: RE | Admit: 2024-02-20 | Discharge: 2024-02-20 | Disposition: A | Discharge status: Home / Self Care | Source: Ambulatory Visit | Attending: General Surgery | Admitting: General Surgery

## 2024-02-20 ENCOUNTER — Other Ambulatory Visit: Payer: Self-pay

## 2024-02-20 ENCOUNTER — Ambulatory Visit: Admitting: Anesthesiology

## 2024-02-20 DIAGNOSIS — J449 Chronic obstructive pulmonary disease, unspecified: Secondary | ICD-10-CM | POA: Insufficient documentation

## 2024-02-20 DIAGNOSIS — E039 Hypothyroidism, unspecified: Secondary | ICD-10-CM | POA: Insufficient documentation

## 2024-02-20 DIAGNOSIS — K4090 Unilateral inguinal hernia, without obstruction or gangrene, not specified as recurrent: Secondary | ICD-10-CM | POA: Insufficient documentation

## 2024-02-20 DIAGNOSIS — Z79899 Other long term (current) drug therapy: Secondary | ICD-10-CM | POA: Diagnosis not present

## 2024-02-20 DIAGNOSIS — Z87891 Personal history of nicotine dependence: Secondary | ICD-10-CM | POA: Insufficient documentation

## 2024-02-20 DIAGNOSIS — Z79890 Hormone replacement therapy (postmenopausal): Secondary | ICD-10-CM | POA: Diagnosis not present

## 2024-02-20 HISTORY — PX: XI ROBOTIC ASSISTED INGUINAL HERNIA REPAIR WITH MESH: SHX6706

## 2024-02-20 SURGERY — REPAIR, HERNIA, INGUINAL, ROBOT-ASSISTED, LAPAROSCOPIC, USING MESH
Anesthesia: General | Site: Inguinal | Laterality: Left

## 2024-02-20 MED ORDER — MIDAZOLAM HCL 2 MG/2ML IJ SOLN
INTRAMUSCULAR | Status: DC | PRN
  Administered 2024-02-20: 2 mg via INTRAVENOUS

## 2024-02-20 MED ORDER — FENTANYL CITRATE (PF) 100 MCG/2ML IJ SOLN
INTRAMUSCULAR | Status: AC
  Filled 2024-02-20: qty 2

## 2024-02-20 MED ORDER — PROPOFOL 10 MG/ML IV BOLUS
INTRAVENOUS | Status: DC | PRN
  Administered 2024-02-20: 100 mg via INTRAVENOUS

## 2024-02-20 MED ORDER — OXYCODONE HCL 5 MG PO TABS
5.0000 mg | ORAL_TABLET | Freq: Once | ORAL | Status: AC | PRN
  Administered 2024-02-20: 5 mg via ORAL

## 2024-02-20 MED ORDER — CHLORHEXIDINE GLUCONATE 0.12 % MT SOLN
OROMUCOSAL | Status: AC
  Filled 2024-02-20: qty 15

## 2024-02-20 MED ORDER — BUPIVACAINE-EPINEPHRINE 0.25% -1:200000 IJ SOLN
INTRAMUSCULAR | Status: DC | PRN
  Administered 2024-02-20: 30 mL

## 2024-02-20 MED ORDER — ROCURONIUM BROMIDE 100 MG/10ML IV SOLN
INTRAVENOUS | Status: DC | PRN
  Administered 2024-02-20: 60 mg via INTRAVENOUS

## 2024-02-20 MED ORDER — PHENYLEPHRINE 80 MCG/ML (10ML) SYRINGE FOR IV PUSH (FOR BLOOD PRESSURE SUPPORT)
PREFILLED_SYRINGE | INTRAVENOUS | Status: DC | PRN
Start: 2024-02-20 — End: 2024-02-20
  Administered 2024-02-20 (×2): 80 ug via INTRAVENOUS

## 2024-02-20 MED ORDER — DEXAMETHASONE SODIUM PHOSPHATE 10 MG/ML IJ SOLN
INTRAMUSCULAR | Status: DC | PRN
  Administered 2024-02-20: 5 mg via INTRAVENOUS

## 2024-02-20 MED ORDER — ONDANSETRON HCL 4 MG/2ML IJ SOLN
INTRAMUSCULAR | Status: DC | PRN
  Administered 2024-02-20: 4 mg via INTRAVENOUS

## 2024-02-20 MED ORDER — METOPROLOL TARTRATE 5 MG/5ML IV SOLN
INTRAVENOUS | Status: DC | PRN
  Administered 2024-02-20: 2 mg via INTRAVENOUS

## 2024-02-20 MED ORDER — KETOROLAC TROMETHAMINE 30 MG/ML IJ SOLN
INTRAMUSCULAR | Status: DC | PRN
  Administered 2024-02-20: 15 mg via INTRAVENOUS

## 2024-02-20 MED ORDER — TRAMADOL HCL 50 MG PO TABS: 50.0000 mg | ORAL_TABLET | Freq: Four times a day (QID) | ORAL | 0 refills | Status: AC | PRN

## 2024-02-20 MED ORDER — FENTANYL CITRATE (PF) 100 MCG/2ML IJ SOLN
INTRAMUSCULAR | Status: DC | PRN
  Administered 2024-02-20: 100 ug via INTRAVENOUS

## 2024-02-20 MED ORDER — BUPIVACAINE-EPINEPHRINE (PF) 0.25% -1:200000 IJ SOLN
INTRAMUSCULAR | Status: AC
  Filled 2024-02-20: qty 30

## 2024-02-20 MED ORDER — OXYCODONE HCL 5 MG/5ML PO SOLN: 5.0000 mg | Freq: Once | ORAL | Status: AC | PRN

## 2024-02-20 MED ORDER — FENTANYL CITRATE (PF) 100 MCG/2ML IJ SOLN: 25.0000 ug | INTRAMUSCULAR | Status: DC | PRN

## 2024-02-20 MED ORDER — LIDOCAINE HCL (CARDIAC) PF 100 MG/5ML IV SOSY
PREFILLED_SYRINGE | INTRAVENOUS | Status: DC | PRN
  Administered 2024-02-20: 100 mg via INTRAVENOUS

## 2024-02-20 MED ORDER — SUGAMMADEX SODIUM 200 MG/2ML IV SOLN
INTRAVENOUS | Status: DC | PRN
  Administered 2024-02-20: 200 mg via INTRAVENOUS

## 2024-02-20 MED ORDER — ACETAMINOPHEN 10 MG/ML IV SOLN
INTRAVENOUS | Status: DC | PRN
  Administered 2024-02-20: 1000 mg via INTRAVENOUS

## 2024-02-20 MED ORDER — CEFAZOLIN SODIUM-DEXTROSE 2-4 GM/100ML-% IV SOLN
INTRAVENOUS | Status: AC
  Filled 2024-02-20: qty 100

## 2024-02-20 MED ORDER — MIDAZOLAM HCL 2 MG/2ML IJ SOLN
INTRAMUSCULAR | Status: AC
  Filled 2024-02-20: qty 2

## 2024-02-20 MED ORDER — OXYCODONE HCL 5 MG PO TABS
ORAL_TABLET | ORAL | Status: AC
Start: 2024-02-20 — End: 2024-02-20
  Filled 2024-02-20: qty 1

## 2024-02-20 SURGICAL SUPPLY — 39 items
BAG PRESSURE INF REUSE 1000 (BAG) IMPLANT
COVER TIP SHEARS 8 DVNC (MISCELLANEOUS) ×1 IMPLANT
COVER WAND RF STERILE (DRAPES) ×1 IMPLANT
DEFOGGER SCOPE WARM SEASHARP (MISCELLANEOUS) ×1 IMPLANT
DERMABOND ADVANCED .7 DNX12 (GAUZE/BANDAGES/DRESSINGS) ×1 IMPLANT
DRAPE ARM DVNC X/XI (DISPOSABLE) ×3 IMPLANT
DRAPE COLUMN DVNC XI (DISPOSABLE) ×1 IMPLANT
ELECTRODE REM PT RTRN 9FT ADLT (ELECTROSURGICAL) ×1 IMPLANT
FORCEPS BPLR FENES DVNC XI (FORCEP) ×1 IMPLANT
GLOVE BIO SURGEON STRL SZ 6.5 (GLOVE) ×2 IMPLANT
GLOVE BIOGEL PI IND STRL 6.5 (GLOVE) ×2 IMPLANT
GLOVE SURG SYN 6.5 PF PI (GLOVE) ×2 IMPLANT
GOWN STRL REUS W/ TWL LRG LVL3 (GOWN DISPOSABLE) ×4 IMPLANT
IRRIGATOR SUCT 8 DISP DVNC XI (IRRIGATION / IRRIGATOR) IMPLANT
IV CATH ANGIO 12GX3 LT BLUE (NEEDLE) IMPLANT
IV NS 1000ML BAXH (IV SOLUTION) IMPLANT
KIT PINK PAD W/HEAD ARM REST (MISCELLANEOUS) ×1 IMPLANT
LABEL OR SOLS (LABEL) IMPLANT
MANIFOLD NEPTUNE II (INSTRUMENTS) ×1 IMPLANT
MESH 3DMAX MID 5X7 LT XLRG (Mesh General) IMPLANT
NDL DRIVE SUT CUT DVNC (INSTRUMENTS) ×1 IMPLANT
NDL HYPO 22X1.5 SAFETY MO (MISCELLANEOUS) ×1 IMPLANT
NDL INSUFFLATION 14GA 120MM (NEEDLE) ×1 IMPLANT
NEEDLE DRIVE SUT CUT DVNC (INSTRUMENTS) ×1 IMPLANT
NEEDLE HYPO 22X1.5 SAFETY MO (MISCELLANEOUS) ×1 IMPLANT
NEEDLE INSUFFLATION 14GA 120MM (NEEDLE) ×1 IMPLANT
OBTURATOR OPTICALSTD 8 DVNC (TROCAR) ×1 IMPLANT
PACK LAP CHOLECYSTECTOMY (MISCELLANEOUS) ×1 IMPLANT
SCISSORS MNPLR CVD DVNC XI (INSTRUMENTS) ×1 IMPLANT
SEAL UNIV 5-12 XI (MISCELLANEOUS) ×3 IMPLANT
SET TUBE SMOKE EVAC HIGH FLOW (TUBING) ×1 IMPLANT
SOLUTION ELECTROSURG ANTI STCK (MISCELLANEOUS) ×1 IMPLANT
SUT STRATA 2-0 23CM CT-2 (SUTURE) ×1 IMPLANT
SUT VIC AB 2-0 SH 27XBRD (SUTURE) ×1 IMPLANT
SUTURE MNCRL 4-0 27XMF (SUTURE) ×1 IMPLANT
TAPE TRANSPORE STRL 2 31045 (GAUZE/BANDAGES/DRESSINGS) IMPLANT
TRAP FLUID SMOKE EVACUATOR (MISCELLANEOUS) ×1 IMPLANT
TRAY FOLEY MTR SLVR 16FR STAT (SET/KITS/TRAYS/PACK) ×1 IMPLANT
WATER STERILE IRR 500ML POUR (IV SOLUTION) ×1 IMPLANT

## 2024-02-20 NOTE — Anesthesia Postprocedure Evaluation (Signed)
 Anesthesia Post Note  Patient: Catherine Ray  Procedure(s) Performed: REPAIR, HERNIA, INGUINAL, ROBOT-ASSISTED, LAPAROSCOPIC, USING MESH (Left: Inguinal)  Patient location during evaluation: PACU Anesthesia Type: General Level of consciousness: awake and alert Pain management: pain level controlled Vital Signs Assessment: post-procedure vital signs reviewed and stable Respiratory status: spontaneous breathing, nonlabored ventilation, respiratory function stable and patient connected to nasal cannula oxygen Cardiovascular status: blood pressure returned to baseline and stable Postop Assessment: no apparent nausea or vomiting Anesthetic complications: no   No notable events documented.   Last Vitals:  Vitals:   02/20/24 1345 02/20/24 1400  BP: (!) 149/86 (!) 153/75  Pulse: 79 77  Resp: 18 13  Temp: (!) 36.2 C   SpO2: 96% 93%    Last Pain:  Vitals:   02/20/24 1400  TempSrc:   PainSc: Asleep                 Lendia LITTIE Mae

## 2024-02-20 NOTE — Interval H&P Note (Signed)
 History and Physical Interval Note:  02/20/2024 11:45 AM  Catherine Ray  has presented today for surgery, with the diagnosis of K40.90 non recurrent unilateral inguinal hernia w/o obstruction or gangrene.  The various methods of treatment have been discussed with the patient and family. After consideration of risks, benefits and other options for treatment, the patient has consented to  Procedure(s): REPAIR, HERNIA, INGUINAL, ROBOT-ASSISTED, LAPAROSCOPIC, USING MESH (Left) as a surgical intervention.  The patient's history has been reviewed, patient examined, no change in status, stable for surgery.  I have reviewed the patient's chart and labs.  Questions were answered to the patient's satisfaction.     Lucas Sjogren

## 2024-02-20 NOTE — Discharge Instructions (Signed)

## 2024-02-20 NOTE — Transfer of Care (Signed)
 Immediate Anesthesia Transfer of Care Note  Patient: Catherine Ray  Procedure(s) Performed: REPAIR, HERNIA, INGUINAL, ROBOT-ASSISTED, LAPAROSCOPIC, USING MESH (Left: Inguinal)  Patient Location: PACU  Anesthesia Type:General  Level of Consciousness: awake, alert , and oriented  Airway & Oxygen Therapy: Patient Spontanous Breathing and Patient connected to face mask oxygen  Post-op Assessment: Report given to RN, Post -op Vital signs reviewed and stable, and Patient moving all extremities  Post vital signs: Reviewed and stable  Last Vitals:  Vitals Value Taken Time  BP 149/86 02/20/24 13:45  Temp 36.2 C 02/20/24 13:45  Pulse 77 02/20/24 13:50  Resp 12 02/20/24 13:50  SpO2 97 % 02/20/24 13:50  Vitals shown include unfiled device data.  Last Pain:  Vitals:   02/20/24 1345  TempSrc:   PainSc: Asleep         Complications: No notable events documented.

## 2024-02-20 NOTE — Op Note (Signed)
 Preoperative diagnosis: Left inguinal hernia.   Postoperative diagnosis: Left inguinal hernia.  Procedure: Robotic assisted Laparoscopic Transabdominal preperitoneal laparoscopic (TAPP) repair of left inguinal hernia.  Anesthesia: GETA  Surgeon: Dr. Cesar Coe  Wound Classification: Clean  Indications:  Patient is a 78 y.o. female developed a symptomatic left inguinal hernia. Repair was indicated.  Findings: 1. Left indirect Inguinal hernia identified 2. Vas deferens and cord structures identified and preserved 3. Bard Extra Large 3D Max MID Anatomical mesh used for repair 4. Adequate hemostasis.   Description of procedure:  The patient was taken to the operating room and the correct side of surgery was verified. The patient was placed supine with right arm tucked at the side. After obtaining adequate anesthesia, the patient's abdomen was prepped and draped in standard sterile fashion. A time-out was completed verifying correct patient, procedure, site, positioning, and implant(s) and/or special equipment prior to beginning this procedure.  An incision was made in a natural skin line above the umbilicus. The fascia was elevated and the Veress needle inserted. Proper position was confirmed by aspiration and saline meniscus test.  The abdomen was insufflated with carbon dioxide to a pressure of 15 mmHg. The patient tolerated insufflation well.  Abdominal cavity was entered using Optiview technique with a millimeter trocar.  No injury was identified.  Another 2 mm trocars were placed lateral to each rectus muscle.  Scissors and bipolar forceps were inserted under direct visualization. At the robotic console: Transverse peritoneal incision is made about 8 cm superior to the inguinal defect. Medial to the epigastric vessels, the parietal compartment is dissected to visualize the rectus muscle. This is carried down to the symphysis pubis and the retropubic space is dissected to expose at least 2  cm contralateral to the midline. Cooper's ligament is exposed and cleared at least 2 cm below the ligament to ensure adequate space for the inferior border of the mesh. Hesselbach's triangle is cleared assessing for a direct hernia. Lateral to the epigastric vessels, the dissection is carried out in visceral compartment continuing in the true preperitoneal plane. Indirect hernia sac, was carefully reduced and separated from the cord structures with medial retraction and a combination of blunt/sharp dissection and focused cautery. This dissection was continued until the cord structures are "parietalized" completely, allowing for visualization of the reflected peritoneum that is continuous with the line originating 2 cm below Coopers medially and across the psoas muscle in the lateral compartment.  The internal ring was interrogated for a cord lipoma. The cord lipoma was reduced to the retroperitoneum and seated dorsal to the preperitoneal mesh. Having achieved a complete dissection with a critical view of the entire myopectineal orifice, an XL mesh was then positioned centered at the iliopubic tract with the medial side crossing the midline and the inferior edge positioned 2 cm below Coopers ligament. The lateral aspect of the mesh extended 3-5 cm beyond the lateral edge of the psoas. The mesh is fixated using an interrupted suture placed to the ipsilateral Coopers ligament. A second suture was done at the medial superior aspect of the mesh fixating this to the rectus complex.  The peritoneal flap is closed with running barbed suture. Additional holes in the peritoneum closed with suture. Preperitoneal space gas aspirated to visualize the peritoneum apposed directly against the mesh and ensure no folding, lifting, or buckling of the mesh. Skin is closed, sterile dressings are applied.  The patient tolerated the procedure well and was taken to the postanesthesia care unit in stable  condition  Specimen:  None  Complications: None  Estimated Blood Loss: 5 mL

## 2024-02-20 NOTE — Anesthesia Preprocedure Evaluation (Signed)
 Anesthesia Evaluation  Patient identified by MRN, date of birth, ID band Patient awake    Reviewed: Allergy & Precautions, NPO status , Patient's Chart, lab work & pertinent test results  History of Anesthesia Complications Negative for: history of anesthetic complications  Airway Mallampati: III  TM Distance: >3 FB Neck ROM: full    Dental no notable dental hx.    Pulmonary COPD,  COPD inhaler, former smoker   Pulmonary exam normal        Cardiovascular negative cardio ROS Normal cardiovascular exam     Neuro/Psych negative neurological ROS  negative psych ROS   GI/Hepatic negative GI ROS, Neg liver ROS,,,  Endo/Other  Hypothyroidism    Renal/GU Renal disease     Musculoskeletal   Abdominal   Peds  Hematology negative hematology ROS (+)   Anesthesia Other Findings Past Medical History: No date: CKD (chronic kidney disease), stage III (HCC) No date: COPD (chronic obstructive pulmonary disease) (HCC) No date: Hypothyroidism No date: Pre-diabetes 2014: Squamous cell cancer of skin of forearm No date: Thyroid  disease  Past Surgical History: No date: BREAST CYST EXCISION; Bilateral     Comment:  neg No date: FOOT SURGERY; Right     Comment:  plantar fascia No date: KNEE SURGERY; Right     Comment:  scope 12/28/2019: QUADRICEPS TENDON REPAIR; Left     Comment:  Procedure: REPAIR OF LEFT GLUTEUS MEDIUS TENDON;                Surgeon: Edie Norleen PARAS, MD;  Location: [facility] ORS;                Service: Orthopedics;  Laterality: Left; No date: SQUAMOUS CELL CARCINOMA EXCISION     Comment:  Arm No date: TONSILLECTOMY  BMI    Body Mass Index: 35.05 kg/m      Reproductive/Obstetrics negative OB ROS                              Anesthesia Physical Anesthesia Plan  ASA: 2  Anesthesia Plan: General ETT   Post-op Pain Management: Toradol  IV (intra-op)* and Ofirmev  IV (intra-op)*    Induction: Intravenous  PONV Risk Score and Plan: 3 and Ondansetron , Dexamethasone , Midazolam  and Treatment may vary due to age or medical condition  Airway Management Planned: Oral ETT  Additional Equipment:   Intra-op Plan:   Post-operative Plan: Extubation in OR  Informed Consent: I have reviewed the patients History and Physical, chart, labs and discussed the procedure including the risks, benefits and alternatives for the proposed anesthesia with the patient or authorized representative who has indicated his/her understanding and acceptance.     Dental Advisory Given  Plan Discussed with: Anesthesiologist, CRNA and Surgeon  Anesthesia Plan Comments: (Patient consented for risks of anesthesia including but not limited to:  - adverse reactions to medications - damage to eyes, teeth, lips or other oral mucosa - nerve damage due to positioning  - sore throat or hoarseness - Damage to heart, brain, nerves, lungs, other parts of body or loss of life  Patient voiced understanding and assent.)        Anesthesia Quick Evaluation

## 2024-02-20 NOTE — Anesthesia Procedure Notes (Signed)
 Procedure Name: Intubation Date/Time: 02/20/2024 12:26 PM  Performed by: Vannie Smaller, CRNAPre-anesthesia Checklist: Patient identified, Patient being monitored, Timeout performed, Emergency Drugs available and Suction available Patient Re-evaluated:Patient Re-evaluated prior to induction Oxygen Delivery Method: Circle system utilized Preoxygenation: Pre-oxygenation with 100% oxygen Induction Type: IV induction Ventilation: Mask ventilation without difficulty Laryngoscope Size: Mac and 3 Grade View: Grade I Tube type: Oral Tube size: 7.0 mm Number of attempts: 1 Airway Equipment and Method: Stylet Placement Confirmation: ETT inserted through vocal cords under direct vision, positive ETCO2 and breath sounds checked- equal and bilateral Secured at: 21 cm Tube secured with: Tape Dental Injury: Teeth and Oropharynx as per pre-operative assessment

## 2024-02-23 ENCOUNTER — Encounter: Payer: Self-pay | Admitting: General Surgery

## 2024-05-06 ENCOUNTER — Other Ambulatory Visit: Payer: Self-pay | Admitting: Specialist

## 2024-05-06 DIAGNOSIS — R918 Other nonspecific abnormal finding of lung field: Secondary | ICD-10-CM

## 2024-05-18 ENCOUNTER — Ambulatory Visit
Admission: RE | Admit: 2024-05-18 | Discharge: 2024-05-18 | Disposition: A | Discharge status: Home / Self Care | Source: Ambulatory Visit | Attending: Specialist | Admitting: Specialist

## 2024-05-18 DIAGNOSIS — R918 Other nonspecific abnormal finding of lung field: Secondary | ICD-10-CM | POA: Insufficient documentation

## 2024-07-12 ENCOUNTER — Ambulatory Visit: Admitting: Urology

## 2024-08-09 ENCOUNTER — Ambulatory Visit: Admitting: Urology

## 2024-08-09 VITALS — BP 127/75 | HR 85 | Ht 63.0 in | Wt 200.0 lb

## 2024-08-09 DIAGNOSIS — N3941 Urge incontinence: Secondary | ICD-10-CM | POA: Diagnosis not present

## 2024-08-09 MED ORDER — GEMTESA 75 MG PO TABS: 1.0000 | ORAL_TABLET | Freq: Every day | ORAL | 11 refills | Status: AC

## 2024-08-09 NOTE — Patient Instructions (Signed)

## 2024-08-09 NOTE — Progress Notes (Signed)
 "  08/09/2024 1:57 PM   Catherine Ray 1946/03/08 [id number]  Referring provider: Suzzane Charleston, PA-C [address] [facility]-Internal Med [place],  [place] [address]  Chief Complaint  Patient presents with   Establish Care   Urinary Incontinence    HPI: I was consulted to assess the patient's urinary incontinence.  She primarily has urge incontinence.  She has no bedwetting but can have foot on the floor syndrome in the middle of the night.  She can leak with coughing sneezing and sometimes with bending and lifting.  Primary symptom is urgency.  Wears 2-4 pads a day moderately wet.  She is quite fastidious in changes and quickly  She voids every 2-3 hours gets up once or twice per night.  Flow was good  She had painful urination several months ago by primary care and had a positive culture.  She never took an overactive bladder medication  She has not had a hysterectomy  No history of kidney stones bladder surgery or recurrent bladder infections.  No neurologic issues.  Bowel function normal   PMH: Past Medical History:  Diagnosis Date   CKD (chronic kidney disease), stage III (HCC)    COPD (chronic obstructive pulmonary disease) (HCC)    Hypothyroidism    Pre-diabetes    Squamous cell cancer of skin of forearm 2014   Thyroid  disease     Surgical History: Past Surgical History:  Procedure Laterality Date   BREAST CYST EXCISION Bilateral    neg   FOOT SURGERY Right    plantar fascia   KNEE SURGERY Right    scope   QUADRICEPS TENDON REPAIR Left 12/28/2019   Procedure: REPAIR OF LEFT GLUTEUS MEDIUS TENDON;  Surgeon: Edie Norleen PARAS, MD;  Location: [facility] ORS;  Service: Orthopedics;  Laterality: Left;   SQUAMOUS CELL CARCINOMA EXCISION     Arm   TONSILLECTOMY     XI ROBOTIC ASSISTED INGUINAL HERNIA REPAIR WITH MESH Left 02/20/2024   Procedure: REPAIR, HERNIA, INGUINAL, ROBOT-ASSISTED, LAPAROSCOPIC, USING MESH;  Surgeon: Rodolph Romano, MD;  Location:  [facility] ORS;  Service: General;  Laterality: Left;    Home Medications:  Allergies as of 08/09/2024       Reactions   Metformin Diarrhea   Codeine Nausea Only        Medication List        Accurate as of August 09, 2024  1:57 PM. If you have any questions, ask your nurse or doctor.          acetaminophen  500 MG tablet Commonly known as: TYLENOL  Take 1,000 mg by mouth every 8 (eight) hours as needed for moderate pain (pain score 4-6) or headache.   albuterol  108 (90 Base) MCG/ACT inhaler Commonly known as: VENTOLIN  HFA Inhale 2 puffs into the lungs every 6 (six) hours as needed for wheezing or shortness of breath.   CALCIUM  600 + D PO Take 2 tablets by mouth 4 (four) times a week.   cetirizine 10 MG tablet Commonly known as: ZYRTEC Take 10 mg by mouth daily.   latanoprost  0.005 % ophthalmic solution Commonly known as: XALATAN  Place 1 drop into both eyes at bedtime.   levothyroxine  75 MCG tablet Commonly known as: SYNTHROID  Take 75 mcg by mouth See admin instructions. Take 75 mcg by mouth on Saturdays and Sundays   levothyroxine  88 MCG tablet Commonly known as: SYNTHROID  Take 88 mcg by mouth every Monday, Tuesday, Wednesday, Thursday, and Friday.   losartan 25 MG tablet Commonly known  as: COZAAR Take 25 mg by mouth at bedtime.   rosuvastatin 5 MG tablet Commonly known as: CRESTOR Take 5 mg by mouth at bedtime.   tiotropium 18 MCG inhalation capsule Commonly known as: SPIRIVA  Place 18 mcg into inhaler and inhale daily.   traMADol  50 MG tablet Commonly known as: Ultram  Take 1 tablet (50 mg total) by mouth every 6 (six) hours as needed.        Allergies: Allergies[1]  Family History: Family History  Problem Relation Age of Onset   Cancer Mother    Hypertension Mother    Diabetes Mother    Breast cancer Mother 81   Heart failure Father    Hypertension Sister    Diabetes Sister    Cancer Sister    Stroke Sister    Breast cancer Sister 30     Social History:  reports that she has quit smoking. Her smoking use included cigarettes. She has never used smokeless tobacco. She reports that she does not drink alcohol and does not use drugs.  ROS:                                        Physical Exam: BP 127/75   Pulse 85   Ht 5' 3 (1.6 m)   Wt 90.7 kg   BMI 35.43 kg/m   Constitutional:  Alert and oriented, No acute distress. HEENT: [place] AT, moist mucus membranes.  Trachea midline, no masses.   Laboratory Data: Lab Results  Component Value Date   WBC 5.7 12/20/2019   HGB 12.5 12/20/2019   HCT 37.1 12/20/2019   MCV 89.4 12/20/2019   PLT 301 12/20/2019    Lab Results  Component Value Date   CREATININE 0.89 12/20/2019    No results found for: PSA  No results found for: TESTOSTERONE  Lab Results  Component Value Date   HGBA1C 6.0 (H) 04/28/2017    Urinalysis No results found for: COLORURINE, APPEARANCEUR, LABSPEC, PHURINE, GLUCOSEU, HGBUR, BILIRUBINUR, KETONESUR, PROTEINUR, UROBILINOGEN, NITRITE, LEUKOCYTESUR  Pertinent Imaging: Could not leave a sample  Assessment & Plan: Patient has mixed incontinence with primarily urge incontinence with frequency and nocturia.  Reevaluate in 6 weeks with Gemtesa  samples and prescription for pelvic semination cystoscopy and get urine to urine culture on that day.  I mentioned the test without detail that she may need future  1. Urge incontinence (Primary)  - Urinalysis, Complete   No follow-ups on file.  [name], MD  [facility] [address], [address] [place], [place] [address] [phone]     [1]  Allergies Allergen Reactions   Metformin Diarrhea   Codeine Nausea Only   "

## 2024-08-11 ENCOUNTER — Encounter: Payer: Self-pay | Admitting: Urology

## 2024-10-25 ENCOUNTER — Other Ambulatory Visit: Admitting: Urology
